# Patient Record
Sex: Female | Born: 1985 | Race: Black or African American | Hispanic: No | Marital: Single | Smoking: Former smoker
Health system: Southern US, Community
[De-identification: ages and names within clinical notes are randomized; demographics above are authoritative.]

## PROBLEM LIST (undated history)

## (undated) ENCOUNTER — Inpatient Hospital Stay (HOSPITAL_COMMUNITY): Payer: Self-pay

## (undated) ENCOUNTER — Ambulatory Visit (HOSPITAL_COMMUNITY): Admission: EM | Payer: Self-pay | Source: Home / Self Care

## (undated) DIAGNOSIS — R51 Headache: Secondary | ICD-10-CM

## (undated) DIAGNOSIS — R519 Headache, unspecified: Secondary | ICD-10-CM

## (undated) DIAGNOSIS — O3430 Maternal care for cervical incompetence, unspecified trimester: Secondary | ICD-10-CM

## (undated) DIAGNOSIS — E119 Type 2 diabetes mellitus without complications: Secondary | ICD-10-CM

## (undated) DIAGNOSIS — N390 Urinary tract infection, site not specified: Secondary | ICD-10-CM

## (undated) HISTORY — PX: DILATION AND CURETTAGE OF UTERUS: SHX78

## (undated) HISTORY — PX: CERVICAL CERCLAGE: SHX1329

---
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
## 2008-04-23 ENCOUNTER — Emergency Department (HOSPITAL_COMMUNITY): Admission: EM | Admit: 2008-04-23 | Discharge: 2008-04-23 | Payer: Self-pay | Admitting: Emergency Medicine

## 2011-03-22 LAB — URINALYSIS, ROUTINE W REFLEX MICROSCOPIC
Bilirubin Urine: NEGATIVE
Nitrite: NEGATIVE
Specific Gravity, Urine: 1.012
Urobilinogen, UA: 1

## 2011-03-22 LAB — GC/CHLAMYDIA PROBE AMP, GENITAL
Chlamydia, DNA Probe: NEGATIVE
GC Probe Amp, Genital: NEGATIVE

## 2011-03-22 LAB — WET PREP, GENITAL
Trich, Wet Prep: NONE SEEN
WBC, Wet Prep HPF POC: NONE SEEN
Yeast Wet Prep HPF POC: NONE SEEN

## 2011-03-22 LAB — URINE MICROSCOPIC-ADD ON

## 2011-04-19 ENCOUNTER — Emergency Department (HOSPITAL_COMMUNITY)
Admission: EM | Admit: 2011-04-19 | Discharge: 2011-04-20 | Disposition: A | Discharge status: Home / Self Care | Payer: Self-pay | Attending: Emergency Medicine | Admitting: Emergency Medicine

## 2011-04-19 DIAGNOSIS — O99891 Other specified diseases and conditions complicating pregnancy: Secondary | ICD-10-CM | POA: Insufficient documentation

## 2011-04-19 DIAGNOSIS — R109 Unspecified abdominal pain: Secondary | ICD-10-CM | POA: Insufficient documentation

## 2011-04-20 ENCOUNTER — Emergency Department (HOSPITAL_COMMUNITY): Payer: Self-pay

## 2011-04-20 LAB — HCG, QUANTITATIVE, PREGNANCY: hCG, Beta Chain, Quant, S: 596 m[IU]/mL — ABNORMAL HIGH (ref <5)

## 2011-04-20 LAB — URINALYSIS, ROUTINE W REFLEX MICROSCOPIC
Glucose, UA: NEGATIVE mg/dL
Hgb urine dipstick: NEGATIVE
Protein, ur: NEGATIVE mg/dL
Specific Gravity, Urine: 1.013 (ref 1.005–1.030)

## 2011-04-20 LAB — POCT PREGNANCY, URINE
Preg Test, Ur: NEGATIVE
Preg Test, Ur: POSITIVE

## 2012-01-15 ENCOUNTER — Emergency Department (HOSPITAL_COMMUNITY)
Admission: EM | Admit: 2012-01-15 | Discharge: 2012-01-15 | Disposition: A | Discharge status: Home / Self Care | Payer: Medicaid Other | Attending: Emergency Medicine | Admitting: Emergency Medicine

## 2012-01-15 ENCOUNTER — Encounter (HOSPITAL_COMMUNITY): Payer: Self-pay | Admitting: *Deleted

## 2012-01-15 ENCOUNTER — Emergency Department (HOSPITAL_COMMUNITY): Payer: Medicaid Other

## 2012-01-15 DIAGNOSIS — O26899 Other specified pregnancy related conditions, unspecified trimester: Secondary | ICD-10-CM | POA: Insufficient documentation

## 2012-01-15 DIAGNOSIS — N949 Unspecified condition associated with female genital organs and menstrual cycle: Secondary | ICD-10-CM | POA: Insufficient documentation

## 2012-01-15 DIAGNOSIS — R109 Unspecified abdominal pain: Secondary | ICD-10-CM | POA: Insufficient documentation

## 2012-01-15 DIAGNOSIS — R102 Pelvic and perineal pain unspecified side: Secondary | ICD-10-CM

## 2012-01-15 DIAGNOSIS — Z331 Pregnant state, incidental: Secondary | ICD-10-CM

## 2012-01-15 LAB — URINALYSIS, ROUTINE W REFLEX MICROSCOPIC
Nitrite: NEGATIVE
Specific Gravity, Urine: 1.014 (ref 1.005–1.030)
pH: 6 (ref 5.0–8.0)

## 2012-01-15 LAB — CBC WITH DIFFERENTIAL/PLATELET
Eosinophils Absolute: 0.2 10*3/uL (ref 0.0–0.7)
Eosinophils Relative: 2 % (ref 0–5)
Hemoglobin: 11.2 g/dL — ABNORMAL LOW (ref 12.0–15.0)
Lymphs Abs: 2.8 10*3/uL (ref 0.7–4.0)
MCH: 29.7 pg (ref 26.0–34.0)
MCV: 84.1 fL (ref 78.0–100.0)
Monocytes Relative: 5 % (ref 3–12)
RBC: 3.77 MIL/uL — ABNORMAL LOW (ref 3.87–5.11)

## 2012-01-15 LAB — HCG, QUANTITATIVE, PREGNANCY: hCG, Beta Chain, Quant, S: 13144 m[IU]/mL — ABNORMAL HIGH (ref <5)

## 2012-01-15 LAB — BASIC METABOLIC PANEL
BUN: 11 mg/dL (ref 6–23)
Calcium: 9.1 mg/dL (ref 8.4–10.5)
GFR calc non Af Amer: >90 mL/min (ref >90)
Glucose, Bld: 91 mg/dL (ref 70–99)
Potassium: 3.4 mEq/L — ABNORMAL LOW (ref 3.5–5.1)

## 2012-01-15 LAB — URINE MICROSCOPIC-ADD ON

## 2012-01-15 LAB — POCT PREGNANCY, URINE: Preg Test, Ur: POSITIVE — AB

## 2012-01-15 LAB — WET PREP, GENITAL: Clue Cells Wet Prep HPF POC: NONE SEEN

## 2012-01-15 MED ORDER — HYDROCODONE-ACETAMINOPHEN 5-325 MG PO TABS
1.0000 | ORAL_TABLET | Freq: Once | ORAL | Status: AC
  Administered 2012-01-15: 1 via ORAL

## 2012-01-15 MED ORDER — PRENATAL COMPLETE 14-0.4 MG PO TABS: 1.0000 | ORAL_TABLET | Freq: Every day | ORAL | Status: DC

## 2012-01-15 MED ORDER — HYDROCODONE-ACETAMINOPHEN 5-325 MG PO TABS
1.0000 | ORAL_TABLET | Freq: Once | ORAL | Status: DC
  Filled 2012-01-15: qty 1

## 2012-01-15 NOTE — ED Provider Notes (Signed)
History     CSN: [id number]  Arrival date & time 01/15/12  0143   First MD Initiated Contact with Patient 01/15/12 0225      Chief Complaint  Patient presents with  . Abdominal Pain    (Consider location/radiation/quality/duration/timing/severity/associated sxs/prior treatment) HPI Comments: Patient presents with 2 days of sharp crampy, lower abdominal pain. The pain comes and goes. She denies any nausea, vomiting, change in bowel habits, dysuria, hematuria, vaginal bleeding or discharge. He does not know when her last period was but states she took a pregnancy test 2 weeks ago was negative. No chest pain or shortness of breath. She is G4 P2. She denies any back pain. Good by mouth intake and urine output.  The history is provided by the patient.    History reviewed. No pertinent past medical history.  Past Surgical History  Procedure Date  . Cholecystectomy     History reviewed. No pertinent family history.  History  Substance Use Topics  . Smoking status: Current Everyday Smoker  . Smokeless tobacco: Not on file  . Alcohol Use: No    OB History    Grav Para Term Preterm Abortions TAB SAB Ect Mult Living                  Review of Systems  Constitutional: Negative for fever, activity change and appetite change.  HENT: Negative for congestion and rhinorrhea.   Respiratory: Negative for cough, chest tightness and shortness of breath.   Cardiovascular: Negative for chest pain.  Gastrointestinal: Positive for abdominal pain. Negative for nausea, vomiting and diarrhea.  Genitourinary: Negative for dysuria, vaginal bleeding, vaginal discharge and difficulty urinating.  Musculoskeletal: Negative for back pain.  Skin: Negative for rash.  Neurological: Negative for dizziness and light-headedness.    Allergies  Review of patient's allergies indicates no known allergies.  Home Medications   Current Outpatient Rx  Name Route Sig Dispense Refill  . ASPIRIN 325 MG PO  TABS Oral Take 325 mg by mouth daily as needed. For pain      BP 128/82  Pulse 86  Temp 97.2 F (36.2 C) (Oral)  Resp 18  SpO2 100%  Physical Exam  Constitutional: She is oriented to person, place, and time. She appears well-developed and well-nourished. No distress.  HENT:  Head: Normocephalic and atraumatic.  Mouth/Throat: Oropharynx is clear and moist. No oropharyngeal exudate.  Eyes: Conjunctivae and EOM are normal. Pupils are equal, round, and reactive to light.  Neck: Normal range of motion. Neck supple.  Cardiovascular: Normal rate, regular rhythm and normal heart sounds.   Pulmonary/Chest: Effort normal and breath sounds normal. No respiratory distress.  Abdominal: Soft. There is tenderness. There is no rebound and no guarding.       Minimal tenderness with out peritoneal signs  Genitourinary:       Normal external genitalia. Cervix closed. No CMT, no adnexal tenderness. Minimal suprapubic pain.  Musculoskeletal: Normal range of motion. She exhibits no edema and no tenderness.  Neurological: She is alert and oriented to person, place, and time. No cranial nerve deficit.  Skin: Skin is warm.    ED Course  Procedures (including critical care time)  Labs Reviewed  URINALYSIS, ROUTINE W REFLEX MICROSCOPIC - Abnormal; Notable for the following:    Leukocytes, UA TRACE (*)     All other components within normal limits  POCT PREGNANCY, URINE - Abnormal; Notable for the following:    Preg Test, Ur POSITIVE (*)     All  other components within normal limits  HCG, SERUM, QUALITATIVE - Abnormal; Notable for the following:    Preg, Serum POSITIVE (*)     All other components within normal limits  CBC WITH DIFFERENTIAL - Abnormal; Notable for the following:    RBC 3.77 (*)     Hemoglobin 11.2 (*)     HCT 31.7 (*)     All other components within normal limits  BASIC METABOLIC PANEL - Abnormal; Notable for the following:    Potassium 3.4 (*)     All other components within  normal limits  WET PREP, GENITAL - Abnormal; Notable for the following:    WBC, Wet Prep HPF POC FEW (*)     All other components within normal limits  URINE MICROSCOPIC-ADD ON  ABO/RH  GC/CHLAMYDIA PROBE AMP, GENITAL  HCG, QUANTITATIVE, PREGNANCY   US Ob Comp Less 14 Wks  01/15/2012  *RADIOLOGY REPORT*  Clinical Data: Pregnant  OBSTETRIC <14 WK [place] AND TRANSVAGINAL OB US  Technique:  Both transabdominal and transvaginal ultrasound examinations were performed for complete evaluation of the gestation as well as the maternal uterus, adnexal regions, and pelvic cul-de-sac.  Transvaginal technique was performed to assess early pregnancy.  Comparison:  None.  Intrauterine gestational sac:  Visualized/normal in shape. Yolk sac: Identified Embryo: Identified Cardiac Activity: Identified Heart Rate: 101 bpm  CRL: 2.3  mm  5 w  5 d        [place] EDC: 09/11/2012  Maternal uterus/adnexae: No subchorionic hemorrhage. Retroverted uterus.  Normal sonographic appearance to the ovaries.  No free fluid.  IMPRESSION: Single intrauterine gestation with cardiac activity documented. Estimated age of 5 weeks 5 days by crown-rump length.  Original Report Authenticated By: Waneta Martins, M.D.   US Ob Transvaginal  01/15/2012  *RADIOLOGY REPORT*  Clinical Data: Pregnant  OBSTETRIC <14 WK [place] AND TRANSVAGINAL OB US  Technique:  Both transabdominal and transvaginal ultrasound examinations were performed for complete evaluation of the gestation as well as the maternal uterus, adnexal regions, and pelvic cul-de-sac.  Transvaginal technique was performed to assess early pregnancy.  Comparison:  None.  Intrauterine gestational sac:  Visualized/normal in shape. Yolk sac: Identified Embryo: Identified Cardiac Activity: Identified Heart Rate: 101 bpm  CRL: 2.3  mm  5 w  5 d        [place] EDC: 09/11/2012  Maternal uterus/adnexae: No subchorionic hemorrhage. Retroverted uterus.  Normal sonographic appearance to the ovaries.  No free fluid.   IMPRESSION: Single intrauterine gestation with cardiac activity documented. Estimated age of 5 weeks 5 days by crown-rump length.  Original Report Authenticated By: Waneta Martins, M.D.     No diagnosis found.    MDM  Crampy lower abdominal pain for the past 2 days without peritoneal signs. Vital stable. No associated symptoms.  Pregnancy test positive.  We'll perform ultrasound rule out ectopic and perform pelvic exam.  IUP confirmed on ultrasound. [redacted] weeks gestation with appropriate fetal heart rate. Pelvic exam benign.  Urinalysis negative. Abdomen soft and nontender without guarding or rebound.  Patient will followup with women's clinic. We'll provide prenatal vitamins. Patient advised to discontinue any aspirin use.    Glynn Octave, MD 01/15/12 [phone]

## 2012-01-15 NOTE — ED Notes (Signed)
Pt states lower abdominal pain. Sharp and in pelvis area. Pt denies problem with urination or bowels. Pt states some nausea but no vomiting.

## 2012-01-15 NOTE — ED Notes (Signed)
Report received, assumed care.  

## 2012-01-17 LAB — GC/CHLAMYDIA PROBE AMP, GENITAL
Chlamydia, DNA Probe: NEGATIVE
GC Probe Amp, Genital: NEGATIVE

## 2012-02-28 ENCOUNTER — Other Ambulatory Visit: Payer: Self-pay

## 2012-03-05 ENCOUNTER — Other Ambulatory Visit (HOSPITAL_COMMUNITY): Payer: Self-pay | Admitting: Obstetrics and Gynecology

## 2012-03-05 DIAGNOSIS — N883 Incompetence of cervix uteri: Secondary | ICD-10-CM

## 2012-03-05 DIAGNOSIS — Z3689 Encounter for other specified antenatal screening: Secondary | ICD-10-CM

## 2012-03-06 ENCOUNTER — Encounter (HOSPITAL_COMMUNITY): Payer: Self-pay | Admitting: Obstetrics and Gynecology

## 2012-03-23 ENCOUNTER — Encounter (HOSPITAL_COMMUNITY): Payer: Self-pay | Admitting: Pharmacist

## 2012-03-30 ENCOUNTER — Ambulatory Visit (HOSPITAL_COMMUNITY)
Admission: RE | Admit: 2012-03-30 | Discharge: 2012-03-30 | Disposition: A | Discharge status: Home / Self Care | Payer: Medicaid Other | Source: Ambulatory Visit | Attending: Obstetrics and Gynecology | Admitting: Obstetrics and Gynecology

## 2012-03-30 ENCOUNTER — Inpatient Hospital Stay (HOSPITAL_COMMUNITY): Payer: Medicaid Other | Admitting: Anesthesiology

## 2012-03-30 ENCOUNTER — Encounter (HOSPITAL_COMMUNITY): Payer: Self-pay | Admitting: Anesthesiology

## 2012-03-30 ENCOUNTER — Encounter (HOSPITAL_COMMUNITY): Payer: Self-pay | Admitting: Obstetrics and Gynecology

## 2012-03-30 ENCOUNTER — Observation Stay (HOSPITAL_COMMUNITY)
Admission: RE | Admit: 2012-03-30 | Discharge: 2012-03-31 | Disposition: A | Discharge status: Home / Self Care | Payer: Medicaid Other | Source: Ambulatory Visit | Attending: Obstetrics and Gynecology | Admitting: Obstetrics and Gynecology

## 2012-03-30 ENCOUNTER — Encounter (HOSPITAL_COMMUNITY): Payer: Self-pay

## 2012-03-30 ENCOUNTER — Other Ambulatory Visit (HOSPITAL_COMMUNITY): Payer: Self-pay | Admitting: Obstetrics and Gynecology

## 2012-03-30 ENCOUNTER — Encounter (HOSPITAL_COMMUNITY)
Admission: RE | Disposition: A | Discharge status: Home / Self Care | Payer: Self-pay | Source: Ambulatory Visit | Attending: Obstetrics and Gynecology

## 2012-03-30 DIAGNOSIS — O358XX0 Maternal care for other (suspected) fetal abnormality and damage, not applicable or unspecified: Secondary | ICD-10-CM | POA: Insufficient documentation

## 2012-03-30 DIAGNOSIS — N883 Incompetence of cervix uteri: Secondary | ICD-10-CM

## 2012-03-30 DIAGNOSIS — O3430 Maternal care for cervical incompetence, unspecified trimester: Secondary | ICD-10-CM | POA: Insufficient documentation

## 2012-03-30 DIAGNOSIS — Z3689 Encounter for other specified antenatal screening: Secondary | ICD-10-CM

## 2012-03-30 DIAGNOSIS — Z363 Encounter for antenatal screening for malformations: Secondary | ICD-10-CM | POA: Insufficient documentation

## 2012-03-30 DIAGNOSIS — O09219 Supervision of pregnancy with history of pre-term labor, unspecified trimester: Secondary | ICD-10-CM | POA: Insufficient documentation

## 2012-03-30 DIAGNOSIS — Z1389 Encounter for screening for other disorder: Secondary | ICD-10-CM | POA: Insufficient documentation

## 2012-03-30 HISTORY — DX: Maternal care for cervical incompetence, unspecified trimester: O34.30

## 2012-03-30 HISTORY — PX: CERVICAL CERCLAGE: SHX1329

## 2012-03-30 LAB — CBC WITH DIFFERENTIAL/PLATELET
Basophils Relative: 0 % (ref 0–1)
Eosinophils Relative: 3 % (ref 0–5)
HCT: 34.9 % — ABNORMAL LOW (ref 36.0–46.0)
Hemoglobin: 12 g/dL (ref 12.0–15.0)
MCHC: 34.4 g/dL (ref 30.0–36.0)
MCV: 84.5 fL (ref 78.0–100.0)
Monocytes Absolute: 0.5 10*3/uL (ref 0.1–1.0)
Monocytes Relative: 4 % (ref 3–12)
Neutro Abs: 8.2 10*3/uL — ABNORMAL HIGH (ref 1.7–7.7)
RDW: 13.1 % (ref 11.5–15.5)

## 2012-03-30 LAB — SURGICAL PCR SCREEN
MRSA, PCR: NEGATIVE
Staphylococcus aureus: NEGATIVE

## 2012-03-30 SURGERY — CERCLAGE, CERVIX, VAGINAL APPROACH
Anesthesia: Spinal | Site: Vagina | Wound class: Clean Contaminated

## 2012-03-30 MED ORDER — DEXTROSE 5 % IV SOLN
500.0000 mg | INTRAVENOUS | Status: DC
  Administered 2012-03-30: 500 mg via INTRAVENOUS
  Filled 2012-03-30 (×2): qty 500

## 2012-03-30 MED ORDER — PHENYLEPHRINE HCL 10 MG/ML IJ SOLN
INTRAMUSCULAR | Status: DC | PRN
  Administered 2012-03-30: 40 ug via INTRAVENOUS
  Administered 2012-03-30 (×2): 80 ug via INTRAVENOUS

## 2012-03-30 MED ORDER — IBUPROFEN 800 MG PO TABS
800.0000 mg | ORAL_TABLET | Freq: Once | ORAL | Status: AC
  Administered 2012-03-30: 800 mg via ORAL
  Filled 2012-03-30: qty 1

## 2012-03-30 MED ORDER — CITRIC ACID-SODIUM CITRATE 334-500 MG/5ML PO SOLN
30.0000 mL | Freq: Once | ORAL | Status: AC
  Administered 2012-03-30: 30 mL via ORAL
  Filled 2012-03-30: qty 15

## 2012-03-30 MED ORDER — LACTATED RINGERS IV SOLN
INTRAVENOUS | Status: DC
  Administered 2012-03-31: 1000 mL via INTRAVENOUS

## 2012-03-30 MED ORDER — PHENYLEPHRINE 40 MCG/ML (10ML) SYRINGE FOR IV PUSH (FOR BLOOD PRESSURE SUPPORT)
PREFILLED_SYRINGE | INTRAVENOUS | Status: AC
  Filled 2012-03-30: qty 5

## 2012-03-30 MED ORDER — KETOROLAC TROMETHAMINE 30 MG/ML IJ SOLN
INTRAMUSCULAR | Status: DC | PRN
  Administered 2012-03-30: 30 mg via INTRAVENOUS

## 2012-03-30 MED ORDER — CALCIUM CARBONATE ANTACID 500 MG PO CHEW
2.0000 | CHEWABLE_TABLET | ORAL | Status: DC | PRN
  Filled 2012-03-30: qty 2

## 2012-03-30 MED ORDER — 0.9 % SODIUM CHLORIDE (POUR BTL) OPTIME
TOPICAL | Status: DC | PRN
  Administered 2012-03-30: 1000 mL

## 2012-03-30 MED ORDER — FENTANYL CITRATE 0.05 MG/ML IJ SOLN: 25.0000 ug | INTRAMUSCULAR | Status: DC | PRN

## 2012-03-30 MED ORDER — ACETAMINOPHEN 325 MG PO TABS: 650.0000 mg | ORAL_TABLET | ORAL | Status: DC | PRN

## 2012-03-30 MED ORDER — PRENATAL MULTIVITAMIN CH
1.0000 | ORAL_TABLET | Freq: Every day | ORAL | Status: DC
  Filled 2012-03-30: qty 1

## 2012-03-30 MED ORDER — LACTATED RINGERS IV SOLN
INTRAVENOUS | Status: DC | PRN
  Administered 2012-03-30 (×2): via INTRAVENOUS

## 2012-03-30 MED ORDER — LACTATED RINGERS IV SOLN
INTRAVENOUS | Status: DC
  Administered 2012-03-30: 18:00:00 via INTRAVENOUS

## 2012-03-30 MED ORDER — DOCUSATE SODIUM 100 MG PO CAPS
100.0000 mg | ORAL_CAPSULE | Freq: Every day | ORAL | Status: DC
  Filled 2012-03-30: qty 1

## 2012-03-30 MED ORDER — CEFAZOLIN SODIUM-DEXTROSE 2-3 GM-% IV SOLR
2.0000 g | INTRAVENOUS | Status: AC
  Administered 2012-03-30: 2 g via INTRAVENOUS
  Filled 2012-03-30: qty 50

## 2012-03-30 MED ORDER — FAMOTIDINE IN NACL 20-0.9 MG/50ML-% IV SOLN
20.0000 mg | Freq: Once | INTRAVENOUS | Status: AC
  Administered 2012-03-30: 20 mg via INTRAVENOUS
  Filled 2012-03-30: qty 50

## 2012-03-30 MED ORDER — ZOLPIDEM TARTRATE 5 MG PO TABS: 5.0000 mg | ORAL_TABLET | Freq: Every evening | ORAL | Status: DC | PRN

## 2012-03-30 MED ORDER — CEFAZOLIN SODIUM-DEXTROSE 2-3 GM-% IV SOLR: 2.0000 g | INTRAVENOUS | Status: DC

## 2012-03-30 MED ORDER — KETOROLAC TROMETHAMINE 30 MG/ML IJ SOLN
INTRAMUSCULAR | Status: AC
  Filled 2012-03-30: qty 1

## 2012-03-30 MED ORDER — KETOROLAC TROMETHAMINE 30 MG/ML IJ SOLN
15.0000 mg | Freq: Once | INTRAMUSCULAR | Status: AC | PRN
Start: 2012-03-30 — End: 2012-03-30

## 2012-03-30 SURGICAL SUPPLY — 26 items
CANISTER SUCTION 2500CC (MISCELLANEOUS) ×2 IMPLANT
CATH FOLEY 2WAY SLVR 30CC 16FR (CATHETERS) ×2 IMPLANT
CATH ROBINSON RED A/P 16FR (CATHETERS) ×2 IMPLANT
CLOTH BEACON ORANGE TIMEOUT ST (SAFETY) ×2 IMPLANT
COUNTER NEEDLE 1200 MAGNETIC (NEEDLE) ×2 IMPLANT
GAUZE SPONGE 4X4 16PLY XRAY LF (GAUZE/BANDAGES/DRESSINGS) ×2 IMPLANT
GLOVE BIO SURGEON STRL SZ 6.5 (GLOVE) ×2 IMPLANT
GLOVE BIO SURGEON STRL SZ7 (GLOVE) ×2 IMPLANT
GLOVE SKINSENSE NS SZ7.5 (GLOVE) ×1
GLOVE SKINSENSE NS SZ8.0 LF (GLOVE) ×1
GLOVE SKINSENSE STRL SZ7.5 (GLOVE) ×1 IMPLANT
GLOVE SKINSENSE STRL SZ8.0 LF (GLOVE) ×1 IMPLANT
GOWN PREVENTION PLUS LG XLONG (DISPOSABLE) ×4 IMPLANT
NEEDLE MAYO .5 CIRCLE (NEEDLE) ×2 IMPLANT
NEEDLE SPNL 22GX3.5 QUINCKE BK (NEEDLE) IMPLANT
NS IRRIG 1000ML POUR BTL (IV SOLUTION) ×2 IMPLANT
PACK VAGINAL MINOR WOMEN LF (CUSTOM PROCEDURE TRAY) ×2 IMPLANT
PAD OB MATERNITY 4.3X12.25 (PERSONAL CARE ITEMS) ×2 IMPLANT
PAD PREP 24X48 CUFFED NSTRL (MISCELLANEOUS) ×2 IMPLANT
SUT PROLENE 1 CTX 30  8455H (SUTURE) ×2
SUT PROLENE 1 CTX 30 8455H (SUTURE) ×2 IMPLANT
SYR CONTROL 10ML LL (SYRINGE) IMPLANT
TOWEL OR 17X24 6PK STRL BLUE (TOWEL DISPOSABLE) ×4 IMPLANT
TUBING NON-CON 1/4 X 20 CONN (TUBING) ×2 IMPLANT
WATER STERILE IRR 1000ML POUR (IV SOLUTION) IMPLANT
YANKAUER SUCT BULB TIP NO VENT (SUCTIONS) ×2 IMPLANT

## 2012-03-30 NOTE — Preoperative (Signed)
Beta Blockers   Reason not to administer Beta Blockers:Not Applicable 

## 2012-03-30 NOTE — Brief Op Note (Signed)
03/30/2012  11:12 PM  PATIENT:  [name]  26 y.o. female  PRE-OPERATIVE DIAGNOSIS:  incompetent cervix  POST-OPERATIVE DIAGNOSIS:  ncompetent cervix  PROCEDURE:  Procedure(s) (LRB) with comments: CERCLAGE CERVICAL (N/A)  SURGEON:  Surgeon(s) and Role:    * Rolly Magri Bovard, MD - Primary  ANESTHESIA:   spinal and IV sedation  EBL:  Total I/O In: 2520.8 [I.V.:2220.8; IV Piggyback:300] Out: 500 [Urine:400; Blood:100]  BLOOD ADMINISTERED:none  DRAINS: none   LOCAL MEDICATIONS USED:  NONE  SPECIMEN:  No Specimen  DISPOSITION OF SPECIMEN:  N/A  COUNTS:  YES  TOURNIQUET:  * No tourniquets in log *  DICTATION: .Other Dictation: Dictation Number [id number]  PLAN OF CARE: Admit for overnight observation  PATIENT DISPOSITION:  PACU - hemodynamically stable.   Delay start of Pharmacological VTE agent (>24hrs) due to surgical blood loss or risk of bleeding: not applicable

## 2012-03-30 NOTE — H&P (Signed)
[name] is a 26 y.o. female [phone] at 17wk with incompetent cervix for rescue cerclage.  On [place] minimal cervix.  D/W MFM was scheduled for prophylactic cerclage next week for questionable indication.  D/w pt r/b/a of rescue cerclage. Maternal Medical History:  Fetal activity: Perceived fetal activity is normal.      OB History    Grav Para Term Preterm Abortions TAB SAB Ect Mult Living   1             G5P2022 G1 MAB G2 24wk dilitation to 1cm, bedrest, delivery at term G3 prophylactic cerclage, term SVD G4 SAB G5 present  + trich, GC, Chl No abn pap  Past Medical History  Diagnosis Date  . Incompetent cervix in pregnancy 03/30/2012  sickle trait  Past Surgical History  Procedure Date  . Cholecystectomy   cerclage WTE Family History: Br CA, DM, CAD, ESRD Social History:  reports that she has been smoking.  She does not have any smokeless tobacco history on file. She reports that she does not drink alcohol or use illicit drugs.     Prenatal Transfer Tool  Maternal Diabetes: No Genetic Screening: Normal Maternal Ultrasounds/Referrals: Abnormal:  Findings:   Other: incompetent cervix Fetal Ultrasounds or other Referrals:  None, Referred to Materal Fetal Medicine  Maternal Substance Abuse:  No h/o tob Significant Maternal Medications:  None Significant Maternal Lab Results:  None Other Comments:  sickle trait  Review of Systems  Constitutional: Negative.   HENT: Negative.   Eyes: Negative.   Respiratory: Negative.   Cardiovascular: Negative.   Gastrointestinal: Negative.   Genitourinary: Negative.   Musculoskeletal: Negative.   Skin: Negative.   Neurological: Negative.   Endo/Heme/Allergies: Negative.   Psychiatric/Behavioral: Negative.       Last menstrual period 12/03/2011. Maternal Exam:  Abdomen: Fundal height is below umbilicus.    Cervix: Cervix evaluated by sterile speculum exam.     Physical Exam  Constitutional: She is oriented to  person, place, and time. She appears well-developed and well-nourished.  HENT:  Head: Normocephalic and atraumatic.  Eyes: Conjunctivae normal are normal. Pupils are equal, round, and reactive to light.  Neck: Normal range of motion. Neck supple.  Cardiovascular: Normal rate and regular rhythm.   Respiratory: Effort normal and breath sounds normal. No respiratory distress.  GI: Soft. Bowel sounds are normal. There is no tenderness.  Musculoskeletal: Normal range of motion.  Neurological: She is alert and oriented to person, place, and time.  Skin: Skin is warm and dry.  Psychiatric: She has a normal mood and affect. Her behavior is normal.    Prenatal labs: ABO, Rh: --/--/O POS (07/28 0301) Antibody:  negative Rubella:  immune RPR:   NR HBsAg:   neg HIV:   neg GBS:   N/A  GC neg/ Chl neg/ First Tri Scr WNL/AFP WNL  Assessment/Plan: Admit for rescue cerclage abx with Azithro and Ancef Ibuprofen 800mg  x 72 hr D/w pt r/b/a rescue cerclage    BOVARD,Archie Shea 03/30/2012, 1:30 PM

## 2012-03-30 NOTE — Transfer of Care (Signed)
Immediate Anesthesia Transfer of Care Note  Patient: [name]  Procedure(s) Performed: Procedure(s) (LRB) with comments: CERCLAGE CERVICAL (N/A)  Patient Location: PACU  Anesthesia Type: Spinal  Level of Consciousness: awake, alert  and oriented  Airway & Oxygen Therapy: Patient Spontanous Breathing  Post-op Assessment: Report given to PACU RN  Post vital signs: Reviewed  Complications: No apparent anesthesia complications

## 2012-03-30 NOTE — H&P (View-Only) (Signed)
Patient ID: [name], female   DOB: July 24, 1985, 24 y.o.   MRN: [id number]   D/W pt cerclage and r/b/a, including but not limited to bleeding, infection, ROM, not prolonging pregnancy.  Pt voices understanding.    Will proceed after pt 8 hrs NPO, ate before presentation to hospital.  Plan for 9:30pm.  Ancef and Zithromax for pre-op Also ibuprofen po q 8hr for 72 hr Possible toradol after procedure. Will use 30cc foley bulb to displace membranes for procedure

## 2012-03-30 NOTE — Progress Notes (Signed)
Patient ID: [name], female   DOB: 1986-04-11, 26 y.o.   MRN: [id number] Pt s/p rescue cerclage  2 stitches, knots at 12 o'clock  Ibuprofen ATC  x 72 hrs Percocet prn

## 2012-03-30 NOTE — Interval H&P Note (Signed)
History and Physical Interval Note:  03/30/2012 9:36 PM  [name]  has presented today for surgery, with the diagnosis of incompetent cervix  The various methods of treatment have been discussed with the patient and family. After consideration of risks, benefits and other options for treatment, the patient has consented to  Procedure(s) (LRB) with comments: CERCLAGE CERVICAL (N/A) as a surgical intervention .  The patient's history has been reviewed, patient examined, no change in status, stable for surgery.  I have reviewed the patient's chart and labs.  Questions were answered to the patient's satisfaction.     BOVARD,Anthonio Mizzell

## 2012-03-30 NOTE — Anesthesia Preprocedure Evaluation (Addendum)
Anesthesia Evaluation  Patient identified by MRN, date of birth, ID band Patient awake    Reviewed: Allergy & Precautions, H&P , NPO status , Patient's Chart, lab work & pertinent test results, reviewed documented beta blocker date and time   History of Anesthesia Complications Negative for: history of anesthetic complications  Airway Mallampati: III TM Distance: >3 FB Neck ROM: full    Dental  (+) Teeth Intact   Pulmonary  breath sounds clear to auscultation        Cardiovascular negative cardio ROS  Rhythm:regular Rate:Normal     Neuro/Psych negative neurological ROS  negative psych ROS   GI/Hepatic negative GI ROS, Neg liver ROS,   Endo/Other  negative endocrine ROS  Renal/GU negative Renal ROS  negative genitourinary   Musculoskeletal   Abdominal   Peds  Hematology negative hematology ROS (+)   Anesthesia Other Findings Last ate spaghetti at 1:40 pm  Reproductive/Obstetrics (+) Pregnancy (incompetent cervix)                          Anesthesia Physical Anesthesia Plan  ASA: II  Anesthesia Plan: Spinal   Post-op Pain Management:    Induction:   Airway Management Planned:   Additional Equipment:   Intra-op Plan:   Post-operative Plan:   Informed Consent: I have reviewed the patients History and Physical, chart, labs and discussed the procedure including the risks, benefits and alternatives for the proposed anesthesia with the patient or authorized representative who has indicated his/her understanding and acceptance.     Plan Discussed with: CRNA and Surgeon  Anesthesia Plan Comments:         Anesthesia Quick Evaluation

## 2012-03-30 NOTE — Brief Op Note (Signed)
03/30/2012  11:01 PM  PATIENT:  [name]  26 y.o. female  PRE-OPERATIVE DIAGNOSIS:  incompetent cervix  POST-OPERATIVE DIAGNOSIS:  ncompetent cervix  PROCEDURE:  Procedure(s) (LRB) with comments: CERCLAGE CERVICAL (N/A) rescue  SURGEON:  Surgeon(s) and Role:    * Kalid Ghan Bovard, MD - Primary  ANESTHESIA:   spinal and IV sedation  EBL:  Total I/O In: 2520.8 [I.V.:2220.8; IV Piggyback:300] Out: 500 [Urine:400; Blood:100]  FINDINGS: 16wk size uterus, membranes to os  BLOOD ADMINISTERED:none  DRAINS: none   LOCAL MEDICATIONS USED:  NONE  SPECIMEN:  No Specimen  DISPOSITION OF SPECIMEN:  N/A  COUNTS:  YES  TOURNIQUET:  * No tourniquets in log *  DICTATION: .Other Dictation: Dictation Number [id number]  PLAN OF CARE: Admit for overnight observation  PATIENT DISPOSITION:  PACU - hemodynamically stable.   Delay start of Pharmacological VTE agent (>24hrs) due to surgical blood loss or risk of bleeding: not applicable

## 2012-03-30 NOTE — Progress Notes (Signed)
To OR via stretcher for scheduled surgery.

## 2012-03-30 NOTE — Anesthesia Procedure Notes (Signed)
Spinal  Patient location during procedure: OR Preanesthetic Checklist Completed: patient identified, site marked, surgical consent, pre-op evaluation, timeout performed, IV checked, risks and benefits discussed and monitors and equipment checked Spinal Block Patient position: sitting Prep: DuraPrep Patient monitoring: heart rate, cardiac monitor, continuous pulse ox and blood pressure Approach: midline Location: L3-4 Injection technique: single-shot Needle Needle type: Sprotte  Needle gauge: 24 G Needle length: 9 cm Assessment Sensory level: T8 Additional Notes Spinal Dosage in OR  Bupivicaine ml       1.3    

## 2012-03-30 NOTE — Progress Notes (Signed)
Patient ID: [name], female   DOB: 03/15/1986, 26 y.o.   MRN: [id number]   D/W pt cerclage and r/b/a, including but not limited to bleeding, infection, ROM, not prolonging pregnancy.  Pt voices understanding.    Will proceed after pt 8 hrs NPO, ate before presentation to hospital.  Plan for 9:30pm.  Ancef and Zithromax for pre-op Also ibuprofen po q 8hr for 72 hr Possible toradol after procedure. Will use 30cc foley bulb to displace membranes for procedure  

## 2012-03-30 NOTE — Progress Notes (Signed)
Dr Rodman Pickle notified of pt in AICU 373 for pre-op prep for rescue cerclage by Dr Ellyn Hack

## 2012-03-30 NOTE — Progress Notes (Signed)
[name]  was seen today for an ultrasound appointment.  See full report in [facility].  Alpha Gula, MD  Single IUP at 16 3/7 weeks Somewhat limited views of the fetal anatomy were obtained due to early gestational age, but no gross fetal anomalies were noted No markers associated with aneuploidy were seen Normal amniotic fluid volume  TVUS - cervical length approx 2 mm with some U-shaped funneling; some debris is noted  On speculum exam, fetal membranes noted at the external os, but not prolapsing into the vagina    Findings were discussed with Dr. Ellyn Hack. Will check CBC with diff now; if elevated WBC count, would recommend amniocentesis to rule out intra-amniotic infection Plan admission for possible rescue cerclage today   Recommend follow up ultrasound in 4 weeks to reasses fetal heart anatomy.

## 2012-03-31 ENCOUNTER — Encounter (HOSPITAL_COMMUNITY): Payer: Self-pay

## 2012-03-31 LAB — CBC
MCH: 29.5 pg (ref 26.0–34.0)
Platelets: 198 10*3/uL (ref 150–400)
RBC: 3.05 MIL/uL — ABNORMAL LOW (ref 3.87–5.11)
WBC: 10.1 10*3/uL (ref 4.0–10.5)

## 2012-03-31 MED ORDER — IBUPROFEN 800 MG PO TABS
800.0000 mg | ORAL_TABLET | Freq: Three times a day (TID) | ORAL | Status: DC
  Administered 2012-03-31: 800 mg via ORAL
  Filled 2012-03-31: qty 1

## 2012-03-31 MED ORDER — IBUPROFEN 800 MG PO TABS: 800.0000 mg | ORAL_TABLET | Freq: Three times a day (TID) | ORAL | Status: DC

## 2012-03-31 MED ORDER — ONDANSETRON HCL 4 MG PO TABS: 8.0000 mg | ORAL_TABLET | Freq: Three times a day (TID) | ORAL | Status: DC | PRN

## 2012-03-31 MED ORDER — OXYCODONE-ACETAMINOPHEN 5-325 MG PO TABS: 1.0000 | ORAL_TABLET | ORAL | Status: DC | PRN

## 2012-03-31 MED ORDER — PRENATAL MULTIVITAMIN CH: 1.0000 | ORAL_TABLET | Freq: Every day | ORAL | Status: DC

## 2012-03-31 NOTE — Progress Notes (Signed)
Patient ID: [name], female   DOB: [birth year], 26 y.o.   MRN: [id number] #1 afebrile No heavy bleeding for d/c.

## 2012-03-31 NOTE — Op Note (Signed)
[name], [name] NO.:  [id number]  MEDICAL RECORD NO.:  [id number]  LOCATION:  9155                          FACILITY:  [facility]  PHYSICIAN:  Sherron Monday, MD        DATE OF BIRTH:  [birth year]  DATE OF PROCEDURE:  03/30/2012 DATE OF DISCHARGE:  03/30/2012                              OPERATIVE REPORT   PREOPERATIVE DIAGNOSIS:  Incompetent cervix.  POSTOPERATIVE DIAGNOSIS:  Incompetent cervix.  PROCEDURE:  Rescue [place] cervical cerclage.  SURGEON:  Sherron Monday, MD  ANESTHESIA:  Spinal with IV sedation.  ESTIMATED BLOOD LOSS:  100 mL.  IV FLUIDS:  20 mL.  URINE OUTPUT:  400 mL I and O cath.  FINDINGS:  Approximately 16-week size uterus with membranes with minimal Cervical length and membranes prolapsing to the os.  COMPLICATIONS:  None.  PATHOLOGY:  None.  DISPOSITION:  Stable to PACU.  PROCEDURE IN DETAIL:  After informed consent was reviewed with the patient including risks, benefits, and alternatives of surgical procedure including but not limited to bleeding, infection, rupture of membranes, and lack of prolongation of pregnancy, she was transported to the OR, where spinal anesthesia was induced and found to be adequate. She was then placed in supine position and then in the [place] stirrups.  Her bladder was sterilely drained.  Using an open-sided speculum, her cervix was visualized with the above findings.  Her vagina was gently cleansed with normal saline.  A 30 mL Foley balloon bulb was placed through her cervix and inflated to move the membranes from the os.  Several bites of the cervix were taken in a counter-clockwise fashion, and the first knot was tied at approximately 12 o'clock and this was approximately 5 mm from end of the cervix and the second stitches procedure approximately 5 mm anterior to this.  The knot was tied at approximately 12 o'clock, and the patient tolerated procedure well.  The cervix was examined after  the stitches were placed and thought to be closed to fingertip and adding back approximately 0.5 cm in length.  The patient tolerated the procedure well.  Sponge, lap, and needle counts were correct x2 at the end of the procedure.     Sherron Monday, MD     JB/MEDQ  D:  03/30/2012  T:  03/31/2012  Job:  [id number]

## 2012-03-31 NOTE — Anesthesia Postprocedure Evaluation (Signed)
  Anesthesia Post-op Note  Patient: [name]  Procedure(s) Performed: Procedure(s) (LRB) with comments: CERCLAGE CERVICAL (N/A)   Patient is awake, responsive, moving her legs, and has signs of resolution of her numbness. Pain and nausea are reasonably well controlled. Vital signs are stable and clinically acceptable. Oxygen saturation is clinically acceptable. There are no apparent anesthetic complications at this time. Patient is ready for discharge.

## 2012-03-31 NOTE — Anesthesia Postprocedure Evaluation (Signed)
  Anesthesia Post-op Note  Patient: [name]  Procedure(s) Performed: Procedure(s) (LRB) with comments: CERCLAGE CERVICAL (N/A)  Patient Location: Antenatal  Anesthesia Type: Spinal  Level of Consciousness: awake and oriented  Airway and Oxygen Therapy: Patient Spontanous Breathing  Post-op Pain: mild  Post-op Assessment: Patient's Cardiovascular Status Stable, Respiratory Function Stable, Patent Airway, No signs of Nausea or vomiting and Pain level controlled  Post-op Vital Signs: stable  Complications: No apparent anesthesia complications

## 2012-04-01 ENCOUNTER — Encounter (HOSPITAL_COMMUNITY): Payer: Self-pay

## 2012-04-01 ENCOUNTER — Inpatient Hospital Stay (HOSPITAL_COMMUNITY)
Admission: AD | Admit: 2012-04-01 | Discharge: 2012-04-04 | DRG: 781 | Disposition: A | Discharge status: Home / Self Care | Payer: Medicaid Other | Source: Ambulatory Visit | Attending: Obstetrics and Gynecology | Admitting: Obstetrics and Gynecology

## 2012-04-01 ENCOUNTER — Inpatient Hospital Stay (HOSPITAL_COMMUNITY): Payer: Medicaid Other

## 2012-04-01 DIAGNOSIS — O3430 Maternal care for cervical incompetence, unspecified trimester: Principal | ICD-10-CM | POA: Diagnosis present

## 2012-04-01 DIAGNOSIS — N390 Urinary tract infection, site not specified: Secondary | ICD-10-CM | POA: Diagnosis present

## 2012-04-01 DIAGNOSIS — O2390 Unspecified genitourinary tract infection in pregnancy, unspecified trimester: Secondary | ICD-10-CM | POA: Diagnosis present

## 2012-04-01 LAB — URINALYSIS, ROUTINE W REFLEX MICROSCOPIC
Glucose, UA: NEGATIVE mg/dL
Leukocytes, UA: NEGATIVE
pH: 6 (ref 5.0–8.0)

## 2012-04-01 LAB — CBC
MCH: 29.6 pg (ref 26.0–34.0)
MCHC: 35.1 g/dL (ref 30.0–36.0)
Platelets: 231 10*3/uL (ref 150–400)
RBC: 3.35 MIL/uL — ABNORMAL LOW (ref 3.87–5.11)

## 2012-04-01 LAB — URINE MICROSCOPIC-ADD ON

## 2012-04-01 MED ORDER — OXYCODONE-ACETAMINOPHEN 5-325 MG PO TABS: 1.0000 | ORAL_TABLET | ORAL | Status: DC | PRN

## 2012-04-01 MED ORDER — LACTATED RINGERS IV BOLUS (SEPSIS)
500.0000 mL | Freq: Once | INTRAVENOUS | Status: AC
  Administered 2012-04-01: 500 mL via INTRAVENOUS

## 2012-04-01 MED ORDER — ZOLPIDEM TARTRATE 5 MG PO TABS: 5.0000 mg | ORAL_TABLET | Freq: Every evening | ORAL | Status: DC | PRN

## 2012-04-01 MED ORDER — SODIUM CHLORIDE 0.9 % IJ SOLN: 3.0000 mL | INTRAMUSCULAR | Status: DC | PRN

## 2012-04-01 MED ORDER — IBUPROFEN 600 MG PO TABS
600.0000 mg | ORAL_TABLET | Freq: Once | ORAL | Status: AC
  Administered 2012-04-01: 600 mg via ORAL
  Filled 2012-04-01: qty 1

## 2012-04-01 MED ORDER — HYDROMORPHONE HCL PF 1 MG/ML IJ SOLN
1.0000 mg | Freq: Once | INTRAMUSCULAR | Status: AC
  Administered 2012-04-01: 1 mg via INTRAMUSCULAR
  Filled 2012-04-01: qty 1

## 2012-04-01 MED ORDER — OXYCODONE-ACETAMINOPHEN 5-325 MG PO TABS
1.0000 | ORAL_TABLET | Freq: Once | ORAL | Status: AC
  Administered 2012-04-01: 1 via ORAL
  Filled 2012-04-01: qty 1

## 2012-04-01 MED ORDER — IBUPROFEN 800 MG PO TABS
800.0000 mg | ORAL_TABLET | Freq: Three times a day (TID) | ORAL | Status: AC
  Administered 2012-04-01 – 2012-04-02 (×6): 800 mg via ORAL
  Filled 2012-04-01 (×7): qty 1

## 2012-04-01 MED ORDER — PRENATAL MULTIVITAMIN CH
1.0000 | ORAL_TABLET | Freq: Every day | ORAL | Status: DC
  Administered 2012-04-01 – 2012-04-04 (×3): 1 via ORAL
  Filled 2012-04-01 (×3): qty 1

## 2012-04-01 MED ORDER — LACTATED RINGERS IV SOLN
INTRAVENOUS | Status: DC
  Administered 2012-04-01 – 2012-04-04 (×8): via INTRAVENOUS

## 2012-04-01 MED ORDER — DOCUSATE SODIUM 100 MG PO CAPS
100.0000 mg | ORAL_CAPSULE | Freq: Every day | ORAL | Status: DC
  Administered 2012-04-01 – 2012-04-04 (×4): 100 mg via ORAL
  Filled 2012-04-01 (×3): qty 1

## 2012-04-01 MED ORDER — ONDANSETRON HCL 4 MG PO TABS
8.0000 mg | ORAL_TABLET | Freq: Once | ORAL | Status: AC
  Administered 2012-04-01: 8 mg via ORAL
  Filled 2012-04-01: qty 2

## 2012-04-01 MED ORDER — CALCIUM CARBONATE ANTACID 500 MG PO CHEW
2.0000 | CHEWABLE_TABLET | ORAL | Status: DC | PRN
  Filled 2012-04-01: qty 2

## 2012-04-01 MED ORDER — BUTORPHANOL TARTRATE 1 MG/ML IJ SOLN: 2.0000 mg | INTRAMUSCULAR | Status: DC | PRN

## 2012-04-01 NOTE — MAU Note (Signed)
W.Muhammed CNM notified of pt's admission and status. Will see pt 

## 2012-04-01 NOTE — Progress Notes (Signed)
Spoke with Dr. Ellyn Hack regarding POC for patient. At this time orders received for expectant management of care. Dr. Ellyn Hack spoke with MFM and agrees with plan at this time. IVF, bedrest, motrin TID, IV pain meds PRN, Urine culture. Discussed with patient POC and instruction given to call if increased pain, bleeding, LOF. Pt verbalizes understanding.

## 2012-04-01 NOTE — MAU Provider Note (Signed)
History     CSN: [id number]  Arrival date & time 04/01/12  0444   None     Chief Complaint  Patient presents with  . Abdominal Pain  . Vaginal Bleeding    (Consider location/radiation/quality/duration/timing/severity/associated sxs/prior treatment) HPI Pt is a G3P2002 at 16.5 wks IUP here with report of intermittent cramping that began at 0230 with spotting of blood.  Pt received a cervical cerclage on 03/30/12 for incompetent cervix.  Pain is described as contractions and rated a 9/10.  Past Medical History  Diagnosis Date  . 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    Past Surgical History  Procedure Date  . Cholecystectomy 2008  . Cervical cerclage     No family history on file.  History  Substance Use Topics  . Smoking status: Current Every Day Smoker  . Smokeless tobacco: Not on file  . Alcohol Use: No    OB History    Grav Para Term Preterm Abortions TAB SAB Ect Mult Living   3 2 2       2       Review of Systems  Gastrointestinal: Positive for nausea and vomiting.  Genitourinary: Positive for vaginal bleeding and pelvic pain.  All other systems reviewed and are negative.    Allergies  Review of patient's allergies indicates no known allergies.  Home Medications  No current outpatient prescriptions on file.  BP 134/89  Pulse 101  Temp 98.4 F (36.9 C) (Oral)  Resp 20  SpO2 100%  LMP 12/03/2011  Physical Exam  Constitutional: She is oriented to person, place, and time. She appears well-developed and well-nourished. No distress.       Appears uncomfortable  HENT:  Head: Normocephalic and atraumatic.  Neck: Normal range of motion. Neck supple. No thyromegaly present.  Cardiovascular: Normal rate, regular rhythm and normal heart sounds.   Pulmonary/Chest: Effort normal and breath sounds normal. No respiratory distress.  Abdominal: Soft. Bowel sounds are normal. There is no tenderness.  Genitourinary:   Mucusy vaginal bleeding; stitch palpated at 12 oclock with cervical opening below stitch measuring approximately 2 cm  Musculoskeletal: Normal range of motion. She exhibits no edema.  Neurological: She is alert and oriented to person, place, and time.  Skin: Skin is warm and dry.    ED Course  Procedures (including critical care time)  Labs Reviewed - No data to display US Ob Detail + 14 Wk  03/30/2012  OBSTETRICAL ULTRASOUND: This exam was performed within a [facility]. The OB US report was generated in the AS system, and faxed to the ordering physician.   This report is also available in [employer] and in the [employer]. See AS Obstetric US report.   US Ob Transvaginal  03/30/2012  OBSTETRICAL ULTRASOUND: This exam was performed within a [facility]. The OB US report was generated in the AS system, and faxed to the ordering physician.   This report is also available in [employer] and in the [employer]. See AS Obstetric US report.   Ultrasound: Cervix completely effaced, cervical width 1.2 cm.   1. Incompetent cervix in pregnancy    Percocet 5/325 PO and Ibuprofen 600 mg PO>miminal relief Dilaudid 1mg  IM Zofran 8 mg PO  MDM   A: Incompetent Cervix Failed Cerclage  P: Consulted with Dr. Ambrose Mantle > DC pt home with preterm labor precautions and pain meds;  Follow-up with Dr. Ellyn Hack on Monday or  return to MAU prn. Education provided to patient in length about the rationale for sending patient home (non viability of fetus if delivered).  Explained unable to inform her about when and if delivery will occur.  At this time it is expectant management.   RX Percocet 5/325 #[facility]  Pt requested recheck of cervix d/t increased pressure. SVE 2-3/100/stitch intact. Pt states she is uncomfortable with plan to d/c to home, she will be alone there caring for 2 small children. Dr. Ambrose Mantle to  MAU to evaluate.

## 2012-04-01 NOTE — Progress Notes (Signed)
Dr. Ellyn Hack called to check on pt. Report given to MD. Orders to put end time of tomorrow night Motirn 800 in. Orders read back and verified.

## 2012-04-01 NOTE — Progress Notes (Signed)
Patient ID: [name], female   DOB: 06/30/1985, 26 y.o.   MRN: [id number] at 16+5 s/p cerclage (2 stitch McDonald, knots at 12 o'clock) Friday, readmitted with pain.  SVE 2cm, questionable change between 2 exams per MAU providers. Stitch felt intact  Was FT-1 after cerclage.  Pt declines removing stitch.  D/w MFM, will check CBC and Ur Cx.  Pt to get IVF continue 72hr ibuprofen course.  If still pregnant tomorrow, t/c amniocentesis.  Reiterated PTL and no survivabilty at 16 weeks.  Will continue to monitor.

## 2012-04-01 NOTE — H&P (Signed)
[name], [name] NO.:  [id number]  MEDICAL RECORD NO.:  [id number]  LOCATION:  9161                          FACILITY:  [facility]  PHYSICIAN:  Malachi Pro. Ambrose Mantle, M.D. DATE OF BIRTH:  [birth year]  DATE OF ADMISSION:  04/01/2012 DATE OF DISCHARGE:                             HISTORY & PHYSICAL   PRESENT ILLNESS:  This is a 26 year old black female, para 2-0-2-2, gravida 5, [redacted] weeks gestation, who underwent a rescue cerclage on March 30, 2012.  At that time, ultrasound showed minimal cervical length.  Dr. Ellyn Hack discussed with maternal fetal medicine.  She was scheduled for rescue cerclage.  PAST MEDICAL HISTORY:  No known drug allergies.  She was thought to have an incompetent cervix, sickle cell trait.  OPERATIONS:  She has had a prior cerclage and cholecystectomy.  FAMILY HISTORY:  She did have family history of breast cancer, diabetes, coronary artery disease, and esophageal reflux disorder.  SOCIAL HISTORY:  The patient had been smoking.  She has no history of smokeless tobacco.  She does not drink alcohol or use illicit drugs.  OBSTETRIC HISTORY:  First pregnancy, a missed abortion.  Second pregnancy, 24 weeks.  She dilated to 1 cm.  She was placed on bed rest. She delivered a term.  Third pregnancy, had a prophylactic cerclage and at term delivery.  Fourth pregnancy, spontaneous abortion.  She does have a history of trichomoniasis gonorrhea and Chlamydia.  PHYSICAL EXAMINATION:  VITAL SIGNS:  On admission, temperature 98.7, pulse 114, respirations 18, blood pressure 115/58. HEART:  Normal size and sounds.  No murmurs. LUNGS:  Clear to auscultation. ABDOMEN:  Consistent with gestational age.  The patient has been examined in the maternity admission unit this morning, 3 times by nurse practitioner's so I have not repeated the exam.  The last practitioner exam thought the cervix was 2-3 cm dilated.  The ultrasound from today suggested the cervix was  open and completely effaced.  Cervical width was 1.2 cm at the time of the exam.  ADMITTING IMPRESSION:  Intrauterine pregnancy at 17 weeks, incompetent cervix or premature dilatation of the cervix secondary to uterine activity.  I had offered the patient since her cervix was thought to be 2-3 cm, dilated to go ahead and remove the stitches.  The patient declines to do that, so I have admitted her for observation and discussed with her the oblique prospects for a good outcome.  I will discuss this case with Dr. Ellyn Hack for tomorrow or later today.  At the present time,  I am admitting her for bedrest.     Malachi Pro. Ambrose Mantle, M.D.     TFH/MEDQ  D:  04/01/2012  T:  04/01/2012  Job:  [id number]

## 2012-04-01 NOTE — MAU Note (Signed)
Patient is brought in by ems with c/o intense abdominal cramping and spotting. She states that she had a cerclage placed on Friday by dr bovard and was discharge on Saturday noon. She had a quarter size spot on her pad. New pad given.

## 2012-04-02 ENCOUNTER — Encounter (HOSPITAL_COMMUNITY): Payer: Self-pay | Admitting: Obstetrics and Gynecology

## 2012-04-02 ENCOUNTER — Inpatient Hospital Stay (HOSPITAL_COMMUNITY): Payer: Medicaid Other

## 2012-04-02 LAB — CBC
MCHC: 34.5 g/dL (ref 30.0–36.0)
Platelets: 199 10*3/uL (ref 150–400)
RDW: 13.2 % (ref 11.5–15.5)

## 2012-04-02 MED ORDER — NITROFURANTOIN MONOHYD MACRO 100 MG PO CAPS
100.0000 mg | ORAL_CAPSULE | Freq: Two times a day (BID) | ORAL | Status: DC
  Administered 2012-04-02 – 2012-04-04 (×5): 100 mg via ORAL
  Filled 2012-04-02 (×5): qty 1

## 2012-04-02 MED ORDER — AZITHROMYCIN 500 MG PO TABS
500.0000 mg | ORAL_TABLET | Freq: Every day | ORAL | Status: AC
  Administered 2012-04-02: 500 mg via ORAL
  Filled 2012-04-02: qty 1

## 2012-04-02 MED ORDER — AZITHROMYCIN 250 MG PO TABS
250.0000 mg | ORAL_TABLET | Freq: Every day | ORAL | Status: DC
  Administered 2012-04-03 – 2012-04-04 (×2): 250 mg via ORAL
  Filled 2012-04-02 (×2): qty 1

## 2012-04-02 MED ORDER — PROGESTERONE MICRONIZED 200 MG PO CAPS
200.0000 mg | ORAL_CAPSULE | Freq: Every day | ORAL | Status: DC
  Administered 2012-04-02 – 2012-04-03 (×2): 200 mg via VAGINAL
  Filled 2012-04-02 (×2): qty 1

## 2012-04-02 MED ORDER — HYDROXYPROGESTERONE CAPROATE 250 MG/ML IM OIL: 250.0000 mg | TOPICAL_OIL | INTRAMUSCULAR | Status: DC

## 2012-04-02 MED ORDER — PNEUMOCOCCAL VAC POLYVALENT 25 MCG/0.5ML IJ INJ
0.5000 mL | INJECTION | INTRAMUSCULAR | Status: AC
  Filled 2012-04-02: qty 0.5

## 2012-04-02 MED ORDER — SODIUM CHLORIDE 0.9 % IV SOLN
2.0000 g | Freq: Four times a day (QID) | INTRAVENOUS | Status: AC
  Administered 2012-04-02 – 2012-04-04 (×8): 2 g via INTRAVENOUS
  Filled 2012-04-02 (×8): qty 2000

## 2012-04-02 MED ORDER — AMOXICILLIN 250 MG PO CAPS
250.0000 mg | ORAL_CAPSULE | Freq: Three times a day (TID) | ORAL | Status: DC
  Filled 2012-04-02: qty 1

## 2012-04-02 NOTE — H&P (Signed)
NAME:  [name], [name] NO.:  MEDICAL RECORD NO.:  [id number]  LOCATION:                                 FACILITY:  PHYSICIAN:  Malachi Pro. Ambrose Mantle, M.D. DATE OF BIRTH:  July 07, 1985  DATE OF ADMISSION:  03/30/2012 DATE OF DISCHARGE:                             HISTORY & PHYSICAL   PRESENT ILLNESS:  26 year old black female, para 2-0-2-2, gravida 5 at [redacted] weeks gestation with an incompetent cervix for rescue cerclage.  On ultrasound, the patient had minimal cervix Dr. Ellyn Hack discussed the situation with maternal fetal medicine specialist, and she was advised to proceed with the rest cerclage.  PATIENT'S OBSTETRIC HISTORY:  She had 1 missed abortion.  Second pregnancy 24 weeks.  Dilatation to 1 cm.  Bed rest, delivery at term. Third pregnancy prophylactics cerclage, delivered at term.  Fourth pregnancy spontaneous abortion and this is her fifth pregnancy.  She has a history of trichomoniasis, gonorrhea,  and Chlamydia.  She has no significant medical history except incompetent cervix and sickle cell trait.  She has had a cholecystectomy, and she has had prior cerclage. She has been smoking.  She does not use smokeless tobacco, does not drink alcohol or use illicit drugs.  After admission to the hospital, Dr. Ellyn Hack took her to the operating room and under spinal anesthesia and IV sedation, Dr. Ellyn Hack performed a cerclage and placed 2 stitches. There was 100 mL of blood loss.  Findings were 16 week size uterus with membranes to the cervical os.  Dr. Ellyn Hack stated that she placed a Foley catheter into the uterus to pull the membranes up in and then was able to suture the cervix.  Postoperatively, the patient has done well.  She has only a scant amount of bleeding, and she is ready for discharge. Initial white count was [id number], hemoglobin 12, hematocrit 34.9, platelet count 247,000.  MRSA and PCR were negative.  Staph aureus negative. Followup hemoglobin 9,  hematocrit 25.8, and white count 10,100.  I suspect the drop in the hemoglobin is due to dilution from IV fluids. The patient shows no sign of blood loss.  FINAL DIAGNOSES:  Intrauterine pregnancy, 16-17 weeks with incompetent cervix.  Operation cerclage procedure by Dr. Ellyn Hack.  FINAL CONDITION:  Improved.  Instructions include our regular discharge instructions to include no vaginal entrance, basically bed rest until she has a chance to talk to Dr. Ellyn Hack on April 02, 2012.  I am giving her a prescription for Motrin  800 mg, 30 tablets, one,  three times a day as needed for discomfort and also to try to keep the uterus at rest.  She is also Delalutin once weekly.  She is to call Dr. Ellyn Hack in 2 days for further instructions but as of now, I have advised her to stay out of work.     Malachi Pro. Ambrose Mantle, M.D.    TFH/MEDQ  D:  03/31/2012  T:  03/31/2012  Job:  [id number]

## 2012-04-02 NOTE — Progress Notes (Signed)
Patient ID: [name], female   DOB: August 01, 1985, 26 y.o.   MRN: [id number]  26yo [id number] at 16+6 with incompetent cervix, s/p cerclage, readmitted with cervical change and contraction pain.  Have discussed POC with pt and MFM.  Pt declined to have cerclage clipped Sunday.  +FM, no LOF, no VB, no ctx.  D/w pt risks of infection and reasons to push for delivery  AFVSS WBC 11 this am gen NAD Abd soft, FNT  FHTs 150's toco doesn't c/o  SSE visually 2 cm, membranes at os, .5 cm thick SVE 1-2cm dilated, 90% effaced, membranes at os, ? Presenting part   48hr IV abx, then 5 days of oral abx 250mg  prometrium pv qhs FHTs q shift Transfer to [facility]

## 2012-04-02 NOTE — Progress Notes (Signed)
Post discharge review completed from dates of 03-30-12 through 03-31-12

## 2012-04-02 NOTE — Progress Notes (Signed)
Patient ID: [name], female   DOB: 1986/05/03, 26 y.o.   MRN: [id number] The pt is afebrile and she reports no pain. Dr. Ellyn Hack will see the pt and decide on management.

## 2012-04-02 NOTE — Progress Notes (Signed)
[name]  was seen today for an ultrasound appointment.  See full report in [facility].  Alpha Gula, MD  Ms. [name] underwent rescue cerclage on 10/11.  She was re admitted yesterday due to abdominal pain, vaginal bleedings and contractions.  Her abdominal pain has completely resolved and she currently denies uterine cramping. She has been afebrile throughout her hospital course.  Her WBC count this AM was 11.2 and she has no signs or symptoms of intraamniotic infection.  Ultrasound today shows U-shaped funneling with the cervix open to approximately 2 cm at the level of the cerclage stitch.  We had a brief discussion regarding prognosis for the fetus.  Given her current gestational age and current exam, the likelihood of achieving viability is low.  With exposed fetal membranes, she is at high risk for developing intra-amniotic infection or PROM.  We discussed amniocentesis - at this current time, I do not feel compelled to perform amniocentesis, but would have a low threshold should she develop an elevated WBC count or any other s/sx of chorioamnionitis.    Single IUP at 16 6/7 weeks s/p rescue cerclage on 10/11, readdmitted due to abdominal pain/ contractions  TVUS - cephalic presentation, U-shaped funneling with membranes at the external os.  The cervix is open to approximately 2 cm  1) Continue inpatient observation 2) Daily CBC while admitted - follow WBC count; would have low threshold for amniocentesis if leukocytosis or other s/sx of intraamniotic infection noted 3) Would remove cerclage if patient develops PROM or active labor- move toward delivery if clinical s/sx of chorioamnionitis 3) IV Ampicllin x 48 hours; Azithromycin (Z-pack)  - would transition of po Amoxicillin after initial 48 hours for full 7 day course of antibiotics 4) Vaginal progesterone supplementation 5) Once IV antibiotics complete - may discharge home with close outpatient follow up 6) Would readmit  at 23 weeks for betamethasone, inpatient management if this gestation is achieved

## 2012-04-03 LAB — CBC
Hemoglobin: 8.4 g/dL — ABNORMAL LOW (ref 12.0–15.0)
MCH: 29.1 pg (ref 26.0–34.0)
RBC: 2.89 MIL/uL — ABNORMAL LOW (ref 3.87–5.11)

## 2012-04-03 LAB — URINE CULTURE: Colony Count: >=100000

## 2012-04-03 NOTE — Progress Notes (Signed)
UR Chart review completed.  

## 2012-04-03 NOTE — Progress Notes (Addendum)
Patient ID: [name], female   DOB: Apr 20, 1986, 26 y.o.   MRN: [id number]  Pt w/o c/o's.  +FM, no LOF, no VB, no ctx; no c/o pressure  AF VSS gen NAD Abd soft, FNT  FHTs 150's  Pt on amp/amox and zithromax for exposed membranes, nightly 17 P OH, weekly delalutein, appreciate MFM consult/advice.  Will d/c after IV abx, readmit at 23 week. Daily CBC  Also treating for UTI with Macrobid for 7 days 17 wks today.

## 2012-04-04 LAB — CBC
HCT: 24.9 % — ABNORMAL LOW (ref 36.0–46.0)
MCV: 85.3 fL (ref 78.0–100.0)
RDW: 13.2 % (ref 11.5–15.5)
WBC: 9.5 10*3/uL (ref 4.0–10.5)

## 2012-04-04 MED ORDER — PROGESTERONE MICRONIZED 200 MG PO CAPS: 200.0000 mg | ORAL_CAPSULE | Freq: Every day | ORAL | Status: DC

## 2012-04-04 MED ORDER — AZITHROMYCIN 250 MG PO TABS: ORAL_TABLET | ORAL | Status: DC

## 2012-04-04 MED ORDER — AMOXICILLIN 250 MG PO CAPS: 250.0000 mg | ORAL_CAPSULE | Freq: Three times a day (TID) | ORAL | Status: DC

## 2012-04-04 MED ORDER — NITROFURANTOIN MONOHYD MACRO 100 MG PO CAPS: 100.0000 mg | ORAL_CAPSULE | Freq: Two times a day (BID) | ORAL | Status: DC

## 2012-04-04 NOTE — Discharge Summary (Signed)
Obstetric Discharge Summary Reason for Admission: advanced cervical dilitation, incompetent cervix Prenatal Procedures: ultrasound and IV antibiotics, MFM consult, SW consult. Intrapartum Procedures: N/A Postpartum Procedures: N/A Complications-Operative and Postpartum: N/A Hemoglobin  Date Value Range Status  04/04/2012 8.5* 12.0 - 15.0 g/dL Final     HCT  Date Value Range Status  04/04/2012 24.9* 36.0 - 46.0 % Final    Physical Exam:  General: alert and no distress Lochia: appropriate Uterine Fundus: firm  Discharge Diagnoses: incompetent cervix, s/p rescue cerclage, IV abx, UTI  Discharge Information: Date: 04/04/2012 Activity: pelvic rest and BED REST with bathroom privileges Diet: routine Medications: PNV and vaginal prometrium, amoxicillin, zithromax, macrobid, delalutein Condition: guarded Instructions: BW CBC, weekly delalutein, BEDREST; readmit if 23 week for BMZ Discharge to: home Follow-up Information    Follow up with BOVARD,Deryk Bozman, MD. (As scheduled, tomorrow at 10am)    Contact information:   [address] [place] [address] [phone]         Pt voices understanding to preterm labor/ SAB precautions, states can bedrest  BOVARD,Jeffrie Lofstrom 04/04/2012, 9:04 AM

## 2012-04-04 NOTE — Progress Notes (Signed)
Patient ID: [name], female   DOB: Sep 25, 1985, 26 y.o.   MRN: [id number]  26yo [id number] at 17+ wk, s/p rescue cerclage at 16+ wk readmitted with more cervical dilitation and cramping.  Found to be cm dilated with membranes at os.  Has received abx for exposed membranes, also vaginal progesterone.  Will readmit at viability.  Until BW CBC.  AFVSS gen NAD Abd soft, FNT  CBC WBC = 9.1  D/C today.  D/c with amox, zithromax, vaginal progesterone to continue 17P IJ on Thursday  macrobid to finish course.

## 2012-04-04 NOTE — Clinical Social Work Psychosocial (Signed)
    Clinical Social Work Department BRIEF PSYCHOSOCIAL ASSESSMENT 04/04/2012  Patient:  [name], [name]     Account Number:  [id number]     Admit date:  04/01/2012  Clinical Social Worker:  Andy Gauss  Date/Time:  04/04/2012 09:27 AM  Referred by:  Physician  Date Referred:  04/04/2012 Referred for  Other - See comment   Other Referral:   ?able support   Interview type:  Patient Other interview type:    PSYCHOSOCIAL DATA Living Status:  WITH MINOR CHILDREN Admitted from facility:   Level of care:   Primary support name:   Primary support relationship to patient:  FRIEND Degree of support available:   Involved    CURRENT CONCERNS Current Concerns  Other - See comment   Other Concerns:    SOCIAL WORK ASSESSMENT / PLAN Sw referral received to assess pt's support system.  Pt is 17 weeks, single parent of 2 small children (77 & 24 years old) and on bed rest.  Pt currently lives with her children.  Upon discharge, pt's friend plans to move in with  her to help with the children.  FOB is not involved. Pt is employed at the [employer] however on leave of absence now.  She denies depression but does admit to feeling stressed about being placed on bed rest and the financial strain it could cause.  Sw encouraged pt to apply for financial resources at DSS.  Pt thanked Sw for resources. Pt has transportation home and seems to be appropriate.  No barriers to discharge.   Assessment/plan status:  No Further Intervention Required Other assessment/ plan:   Information/referral to community resources:   DSS for financial assistance programs.    PATIENT'S/FAMILY'S RESPONSE TO PLAN OF CARE: Pt was receptive to resources provided and reason for consult.

## 2012-04-06 ENCOUNTER — Ambulatory Visit (HOSPITAL_COMMUNITY)
Admission: AD | Admit: 2012-04-06 | Payer: Medicaid Other | Source: Ambulatory Visit | Admitting: Obstetrics and Gynecology

## 2012-04-06 ENCOUNTER — Encounter (HOSPITAL_COMMUNITY): Admission: AD | Payer: Self-pay | Source: Ambulatory Visit

## 2012-04-06 ENCOUNTER — Encounter (HOSPITAL_COMMUNITY): Payer: Self-pay | Admitting: *Deleted

## 2012-04-06 ENCOUNTER — Observation Stay (HOSPITAL_COMMUNITY)
Admission: AD | Admit: 2012-04-06 | Discharge: 2012-04-07 | Disposition: A | Discharge status: Home / Self Care | Payer: Medicaid Other | Source: Ambulatory Visit | Attending: Obstetrics and Gynecology | Admitting: Obstetrics and Gynecology

## 2012-04-06 DIAGNOSIS — O021 Missed abortion: Principal | ICD-10-CM | POA: Insufficient documentation

## 2012-04-06 DIAGNOSIS — O3430 Maternal care for cervical incompetence, unspecified trimester: Secondary | ICD-10-CM | POA: Insufficient documentation

## 2012-04-06 DIAGNOSIS — O0330 Unspecified complication following incomplete spontaneous abortion: Secondary | ICD-10-CM

## 2012-04-06 LAB — CBC
HCT: 27.2 % — ABNORMAL LOW (ref 36.0–46.0)
Hemoglobin: 9.3 g/dL — ABNORMAL LOW (ref 12.0–15.0)
MCV: 85.5 fL (ref 78.0–100.0)
Platelets: 256 10*3/uL (ref 150–400)
RBC: 3.18 MIL/uL — ABNORMAL LOW (ref 3.87–5.11)
WBC: 9.7 10*3/uL (ref 4.0–10.5)

## 2012-04-06 LAB — TYPE AND SCREEN: ABO/RH(D): O POS

## 2012-04-06 SURGERY — CERCLAGE, CERVIX, VAGINAL APPROACH
Anesthesia: General

## 2012-04-06 MED ORDER — BUTORPHANOL TARTRATE 1 MG/ML IJ SOLN: 1.0000 mg | INTRAMUSCULAR | Status: DC | PRN

## 2012-04-06 MED ORDER — PROMETHAZINE HCL 25 MG/ML IJ SOLN: 12.5000 mg | Freq: Once | INTRAMUSCULAR | Status: AC | PRN

## 2012-04-06 MED ORDER — CITRIC ACID-SODIUM CITRATE 334-500 MG/5ML PO SOLN
30.0000 mL | ORAL | Status: DC | PRN
  Administered 2012-04-07: 30 mL via ORAL
  Filled 2012-04-06: qty 15

## 2012-04-06 MED ORDER — IBUPROFEN 600 MG PO TABS: 600.0000 mg | ORAL_TABLET | Freq: Four times a day (QID) | ORAL | Status: DC | PRN

## 2012-04-06 MED ORDER — MISOPROSTOL 25 MCG QUARTER TABLET: 8000.0000 ug | ORAL_TABLET | Freq: Once | ORAL | Status: DC | PRN

## 2012-04-06 MED ORDER — MISOPROSTOL 200 MCG PO TABS
600.0000 ug | ORAL_TABLET | ORAL | Status: DC
  Administered 2012-04-06: 600 ug via ORAL

## 2012-04-06 MED ORDER — NALBUPHINE SYRINGE 5 MG/0.5 ML
5.0000 mg | INJECTION | Freq: Once | INTRAMUSCULAR | Status: AC | PRN
  Filled 2012-04-06: qty 0.5

## 2012-04-06 MED ORDER — ONDANSETRON HCL 4 MG/2ML IJ SOLN: 4.0000 mg | Freq: Four times a day (QID) | INTRAMUSCULAR | Status: DC | PRN

## 2012-04-06 MED ORDER — LACTATED RINGERS IV SOLN
INTRAVENOUS | Status: DC
  Administered 2012-04-06: 23:00:00 via INTRAVENOUS

## 2012-04-06 MED ORDER — SODIUM CHLORIDE 0.9 % IV SOLN
INTRAVENOUS | Status: DC
  Administered 2012-04-06: 23:00:00 via INTRAVENOUS

## 2012-04-06 MED ORDER — MISOPROSTOL 200 MCG PO TABS
800.0000 ug | ORAL_TABLET | Freq: Once | ORAL | Status: AC | PRN
  Filled 2012-04-06: qty 4

## 2012-04-06 MED ORDER — OXYCODONE-ACETAMINOPHEN 5-325 MG PO TABS: 1.0000 | ORAL_TABLET | ORAL | Status: DC | PRN

## 2012-04-06 MED ORDER — ACETAMINOPHEN 325 MG PO TABS: 650.0000 mg | ORAL_TABLET | ORAL | Status: DC | PRN

## 2012-04-06 NOTE — Treatment Plan (Signed)
Report called to [employer] [facility] RN birthing suites, patient may come to 160

## 2012-04-06 NOTE — MAU Note (Signed)
Dr. Richardson at bedside.

## 2012-04-06 NOTE — MAU Provider Note (Signed)
Chief Complaint: No chief complaint on file.  First Provider Initiated Contact with Patient 04/06/12 2102      SUBJECTIVE HPI: [name] is a 26 y.o. G3P2002 at [redacted]w[redacted]d by LMP who presents to maternity admissions by EMS with delivery at home. Patient had a rescue cerclage placed 03/30/12 by Dr. Ellyn Hack and had been admitted for cervical dilation and bulging of membranes 04/01/2012 through 04/04/2012. Received antibiotics and was on bedrest.  Baby was transported by separate ambulance from mom do to mother's severe distress. Heart rate was present upon arrival, but he passed away soon afterward. See nurse's documentation. Neonatologist was present to assess his gestational age in case he had been more mature than what was reported by EMS. Neonatologist agreed that he appeared to be 17 weeks and upon review chart it was determined that this is a well dated pregnancy. Patient denies fever, chills. Reported nausea en route to the hospital and was given Zofran. Patient appears to be in some discomfort, but denies pain medication.  Past Medical History  Diagnosis Date  . 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   OB History    Grav Para Term Preterm Abortions TAB SAB Ect Mult Living   3 2 2       2      # Outc Date GA Lbr Len/2nd Wgt Sex Del Anes PTL Lv   1 TRM 7/08 [redacted]w[redacted]d  7lb8oz(3.402kg) M SVD EPI Yes Yes   2 TRM 2/11 [redacted]w[redacted]d  7lb5oz(3.317kg) F SVD EPI  Yes   Comments: cerclage at 14 wks   3 CUR              Past Surgical History  Procedure Date  . Cholecystectomy 2008  . Cervical cerclage   . Cervical cerclage 03/30/2012    Procedure: CERCLAGE CERVICAL;  Surgeon: Sherron Monday, MD;  Location: [facility];  Service: Gynecology;  Laterality: N/A;   History   Social History  . Marital Status: Single    Spouse Name: N/A    Number of Children: N/A  . Years of Education: N/A   Occupational History  . Not on file.   Social History Main Topics  . Smoking  status: Former Smoker    Types: Cigarettes  . Smokeless tobacco: Never Used  . Alcohol Use: No  . Drug Use: No  . Sexually Active: No   Other Topics Concern  . Not on file   Social History Narrative  . No narrative on file   No current facility-administered medications on file prior to encounter.   Current Outpatient Prescriptions on File Prior to Encounter  Medication Sig Dispense Refill  . amoxicillin (AMOXIL) 250 MG capsule Take 1 capsule (250 mg total) by mouth every 8 (eight) hours.  15 capsule  0  . azithromycin (ZITHROMAX) 250 MG tablet One daily x 3 days  3 each  0  . nitrofurantoin, macrocrystal-monohydrate, (MACROBID) 100 MG capsule Take 1 capsule (100 mg total) by mouth every 12 (twelve) hours.  10 capsule  0  . Prenatal Vit-Fe Fumarate-FA (PRENATAL MULTIVITAMIN) TABS Take 1 tablet by mouth daily.      . progesterone (PROMETRIUM) 200 MG capsule Take 1 capsule (200 mg total) by mouth at bedtime.  30 capsule  5  . ondansetron (ZOFRAN) 8 MG tablet Take 8 mg by mouth every 8 (eight) hours as needed. For nausea      . oxyCODONE-acetaminophen (PERCOCET/ROXICET) 5-325 MG per tablet Take 1 tablet by mouth  every 4 (four) hours as needed for pain.  20 tablet  0   No Known Allergies  ROS: Pertinent items in HPI  OBJECTIVE Blood pressure 134/67, pulse 116, resp. rate 18, last menstrual period 12/03/2011. GENERAL: Well-developed, well-nourished female in mild distress. Flat affect.  HEENT: Normocephalic HEART:  tachycardic RESP: normal effort ABDOMEN: Soft, non-tender,  EXTREMITIES: Nontender, no edema NEURO: Alert and oriented SPECULUM EXAM: deferred  Umbilical cord visible at vaginal opening. Scant blood on towels and bedding under patient during EMS transport. Scant bleeding from vagina.  LAB RESULTS No results found for this or any previous visit (from the past 24 hour(s)).  IMAGING  MAU COURSE 2045: Attempted to deliver placenta with gentle cord traction without  success. Dr. Senaida Ores notified of patient delivery at home and arrival to MAU. Will come to see patient.  CBC, type and screen, Q10 minute vital signs ordered. Offered medication for pain and nausea. Patient declined. We'll monitor patient's bleeding closely until Dr. Berenda Morale arrival. 2115:Dr. Senaida Ores at bedside. Assuming care of patient.  ASSESSMENT 17 week 3 day incomplete miscarriage due to incompetent cervix.   PLAN Dr. Senaida Ores assuming care of patient.  Dorathy Kinsman, CNM 04/06/2012  9:24 PM    Pt seen and appropriately grieving.  SSE done and cerclage x 2 removed without difficulty.  Minimal bleeding noted.  Traction on cord and attempt to tease out with ring forcep unsuccessful.  Appears still attached to uterine wall.  D/w pt her options and we agree to try cytotec to get placenta out and admit to observation.  Will send to L&D as AICU full.  of cytotec moistened and placed in posterior fornix.  Pt had full meal of chicken and rice at 7-730pm.  Will keep NPO until we see if placenta passes and does not need a D&C.  Pt given emotional support and held baby.  Her friend and children are here now.

## 2012-04-06 NOTE — MAU Note (Signed)
Ems arrival

## 2012-04-07 ENCOUNTER — Observation Stay (HOSPITAL_COMMUNITY): Payer: Medicaid Other | Admitting: Anesthesiology

## 2012-04-07 ENCOUNTER — Encounter (HOSPITAL_COMMUNITY): Payer: Self-pay | Admitting: Anesthesiology

## 2012-04-07 ENCOUNTER — Encounter (HOSPITAL_COMMUNITY): Payer: Self-pay | Admitting: *Deleted

## 2012-04-07 ENCOUNTER — Encounter (HOSPITAL_COMMUNITY)
Admission: AD | Disposition: A | Discharge status: Home / Self Care | Payer: Self-pay | Source: Ambulatory Visit | Attending: Obstetrics and Gynecology

## 2012-04-07 HISTORY — PX: DILATION AND EVACUATION: SHX1459

## 2012-04-07 LAB — CBC
HCT: 21.3 % — ABNORMAL LOW (ref 36.0–46.0)
Hemoglobin: 7.3 g/dL — ABNORMAL LOW (ref 12.0–15.0)
MCHC: 34.3 g/dL (ref 30.0–36.0)
RBC: 2.49 MIL/uL — ABNORMAL LOW (ref 3.87–5.11)
WBC: 10.7 10*3/uL — ABNORMAL HIGH (ref 4.0–10.5)

## 2012-04-07 SURGERY — DILATION AND EVACUATION, UTERUS
Anesthesia: Spinal | Site: Vagina | Wound class: Clean Contaminated

## 2012-04-07 MED ORDER — OXYCODONE-ACETAMINOPHEN 5-325 MG PO TABS
1.0000 | ORAL_TABLET | ORAL | Status: DC | PRN
  Administered 2012-04-07: 2 via ORAL
  Filled 2012-04-07: qty 2

## 2012-04-07 MED ORDER — BENZOCAINE-MENTHOL 20-0.5 % EX AERO
1.0000 "application " | INHALATION_SPRAY | CUTANEOUS | Status: DC | PRN
  Administered 2012-04-07: 1 via TOPICAL
  Filled 2012-04-07: qty 56

## 2012-04-07 MED ORDER — PHENYLEPHRINE HCL 10 MG/ML IJ SOLN
INTRAMUSCULAR | Status: DC | PRN
  Administered 2012-04-07: 80 ug via INTRAVENOUS
  Administered 2012-04-07: 40 ug via INTRAVENOUS
  Administered 2012-04-07: 80 ug via INTRAVENOUS

## 2012-04-07 MED ORDER — LACTATED RINGERS IV SOLN: INTRAVENOUS | Status: DC

## 2012-04-07 MED ORDER — ONDANSETRON HCL 4 MG/2ML IJ SOLN: 4.0000 mg | INTRAMUSCULAR | Status: DC | PRN

## 2012-04-07 MED ORDER — LIDOCAINE IN DEXTROSE 5-7.5 % IV SOLN
INTRAVENOUS | Status: AC
  Filled 2012-04-07: qty 2

## 2012-04-07 MED ORDER — FENTANYL CITRATE 0.05 MG/ML IJ SOLN
25.0000 ug | INTRAMUSCULAR | Status: DC | PRN
  Administered 2012-04-07 (×2): 50 ug via INTRAVENOUS

## 2012-04-07 MED ORDER — SENNOSIDES-DOCUSATE SODIUM 8.6-50 MG PO TABS: 2.0000 | ORAL_TABLET | Freq: Every day | ORAL | Status: DC

## 2012-04-07 MED ORDER — LIDOCAINE IN DEXTROSE 5-7.5 % IV SOLN
INTRAVENOUS | Status: DC | PRN
  Administered 2012-04-07: 50 mg via INTRATHECAL

## 2012-04-07 MED ORDER — DIPHENHYDRAMINE HCL 25 MG PO CAPS: 25.0000 mg | ORAL_CAPSULE | Freq: Four times a day (QID) | ORAL | Status: DC | PRN

## 2012-04-07 MED ORDER — DOCUSATE SODIUM 100 MG PO CAPS: 100.0000 mg | ORAL_CAPSULE | Freq: Two times a day (BID) | ORAL | Status: DC | PRN

## 2012-04-07 MED ORDER — KETOROLAC TROMETHAMINE 30 MG/ML IJ SOLN: 15.0000 mg | Freq: Once | INTRAMUSCULAR | Status: DC | PRN

## 2012-04-07 MED ORDER — ONDANSETRON HCL 4 MG PO TABS: 4.0000 mg | ORAL_TABLET | ORAL | Status: DC | PRN

## 2012-04-07 MED ORDER — FENTANYL CITRATE 0.05 MG/ML IJ SOLN
INTRAMUSCULAR | Status: AC
  Administered 2012-04-07: 50 ug via INTRAVENOUS
  Filled 2012-04-07: qty 2

## 2012-04-07 MED ORDER — LACTATED RINGERS IV SOLN
INTRAVENOUS | Status: DC | PRN
  Administered 2012-04-07: via INTRAVENOUS

## 2012-04-07 MED ORDER — IBUPROFEN 600 MG PO TABS
600.0000 mg | ORAL_TABLET | Freq: Four times a day (QID) | ORAL | Status: DC
  Administered 2012-04-07: 600 mg via ORAL
  Filled 2012-04-07: qty 1

## 2012-04-07 MED ORDER — DIBUCAINE 1 % RE OINT: 1.0000 "application " | TOPICAL_OINTMENT | RECTAL | Status: DC | PRN

## 2012-04-07 MED ORDER — ZOLPIDEM TARTRATE 5 MG PO TABS: 5.0000 mg | ORAL_TABLET | Freq: Every evening | ORAL | Status: DC | PRN

## 2012-04-07 MED ORDER — SODIUM CHLORIDE 0.9 % IV SOLN
3.0000 g | INTRAVENOUS | Status: DC | PRN
  Administered 2012-04-07: 3 g via INTRAVENOUS

## 2012-04-07 MED ORDER — IBUPROFEN 600 MG PO TABS: 600.0000 mg | ORAL_TABLET | Freq: Four times a day (QID) | ORAL | Status: DC

## 2012-04-07 MED ORDER — SODIUM CHLORIDE 0.9 % IV SOLN
3.0000 g | Freq: Once | INTRAVENOUS | Status: DC
  Filled 2012-04-07: qty 3

## 2012-04-07 MED ORDER — SIMETHICONE 80 MG PO CHEW: 80.0000 mg | CHEWABLE_TABLET | ORAL | Status: DC | PRN

## 2012-04-07 MED ORDER — PHENYLEPHRINE 40 MCG/ML (10ML) SYRINGE FOR IV PUSH (FOR BLOOD PRESSURE SUPPORT)
PREFILLED_SYRINGE | INTRAVENOUS | Status: AC
  Filled 2012-04-07: qty 10

## 2012-04-07 MED ORDER — OXYCODONE-ACETAMINOPHEN 5-325 MG PO TABS: 1.0000 | ORAL_TABLET | ORAL | Status: DC | PRN

## 2012-04-07 MED ORDER — MIDAZOLAM HCL 2 MG/2ML IJ SOLN
INTRAMUSCULAR | Status: AC
  Filled 2012-04-07: qty 2

## 2012-04-07 MED ORDER — SODIUM CHLORIDE 0.9 % IJ SOLN: 3.0000 mL | INTRAMUSCULAR | Status: DC | PRN

## 2012-04-07 MED ORDER — WITCH HAZEL-GLYCERIN EX PADS: 1.0000 "application " | MEDICATED_PAD | CUTANEOUS | Status: DC | PRN

## 2012-04-07 MED ORDER — MIDAZOLAM HCL 5 MG/5ML IJ SOLN
INTRAMUSCULAR | Status: DC | PRN
  Administered 2012-04-07 (×2): 1 mg via INTRAVENOUS

## 2012-04-07 MED ORDER — OXYTOCIN 40 UNITS IN LACTATED RINGERS INFUSION - SIMPLE MED
INTRAVENOUS | Status: AC
  Filled 2012-04-07: qty 1000

## 2012-04-07 SURGICAL SUPPLY — 19 items
CATH ROBINSON RED A/P 16FR (CATHETERS) ×2 IMPLANT
CLOTH BEACON ORANGE TIMEOUT ST (SAFETY) ×2 IMPLANT
DECANTER SPIKE VIAL GLASS SM (MISCELLANEOUS) IMPLANT
GLOVE BIO SURGEON STRL SZ 6.5 (GLOVE) ×4 IMPLANT
GOWN STRL REIN XL XLG (GOWN DISPOSABLE) ×4 IMPLANT
KIT BERKELEY 1ST TRIMESTER 3/8 (MISCELLANEOUS) IMPLANT
NEEDLE SPNL 22GX3.5 QUINCKE BK (NEEDLE) IMPLANT
NS IRRIG 1000ML POUR BTL (IV SOLUTION) ×2 IMPLANT
PACK VAGINAL MINOR WOMEN LF (CUSTOM PROCEDURE TRAY) ×2 IMPLANT
PAD OB MATERNITY 4.3X12.25 (PERSONAL CARE ITEMS) ×2 IMPLANT
PAD PREP 24X48 CUFFED NSTRL (MISCELLANEOUS) ×2 IMPLANT
SET BERKELEY SUCTION TUBING (SUCTIONS) IMPLANT
SYR CONTROL 10ML LL (SYRINGE) IMPLANT
TOWEL OR 17X24 6PK STRL BLUE (TOWEL DISPOSABLE) ×4 IMPLANT
VACURETTE 10 RIGID CVD (CANNULA) IMPLANT
VACURETTE 7MM CVD STRL WRAP (CANNULA) IMPLANT
VACURETTE 8 RIGID CVD (CANNULA) IMPLANT
VACURETTE 9 RIGID CVD (CANNULA) IMPLANT
WATER STERILE IRR 1000ML POUR (IV SOLUTION) IMPLANT

## 2012-04-07 NOTE — Anesthesia Postprocedure Evaluation (Signed)
  Anesthesia Post-op Note  Patient: [name]  Procedure(s) Performed: Procedure(s) (LRB) with comments: DILATATION AND EVACUATION (N/A)  Patient Location: [facility]  Anesthesia Type: Spinal  Level of Consciousness: awake  Airway and Oxygen Therapy: Patient Spontanous Breathing  Post-op Pain: mild  Post-op Assessment: Patient's Cardiovascular Status Stable and Respiratory Function Stable  Post-op Vital Signs: stable  Complications: No apparent anesthesia complications

## 2012-04-07 NOTE — Op Note (Signed)
Operative note  Preoperative diagnosis Incompetent cervix with failed rescue cerclage and delivery of 17+ week fetus Retained placenta Heavy vaginal bleeding  Postoperative diagnosis Same  Procedure Curettage and manual removal of placenta  Surgeon Dr. Huel Cote  Anesthesia Spinal  Fluids Total estimated blood loss for delivery 400 cc IV fluids 800 cc LR Urine output straight catheter approximately 100 cc prior to procedure  Findings Cervix was dilated 3-4 cm and the uterine fundus easily entered. The placenta was first manually removed in fragments with some remaining tissue palpable in the fundus. This was removed with a banjo curette.  Pathology Placenta sent  Procedure note After informed consent was obtained from the patient she was taken to the operating room where a spinal anesthesia was obtained by Dr. Larey Seat without difficulty. The patient was then prepped and draped in the normal sterile fashion in the dorsal lithotomy position and an appropriate time out performed. Patient was noted to have heavy vaginal bleeding with several large clots passing after she was placed in position. Once the patient was draped in a manual exploration of the uterine fundus was performed and the placenta was teased out and separated from the uterine wall manually. The bulk of the placenta was delivered in this fashion, but there were still fragments palpable in the upper fundus. A speculum was then placed in the vagina and the anterior lip of the cervix grasped with a ring forcep. A banjo curette was then easily introduced into the uterine fundus and in several passes a moderate amount of placental tissue obtained. When no further tissue was forthcoming the speculum was removed and manual exploration again performed. At this point no further fragments were felt and the uterus contracted well. The patient's bleeding was observed for an additional 5-10 minutes and appeared minimal.  Therefore all instruments and sponges were removed from the vagina and the patient was taken to the recovery room in good condition.

## 2012-04-07 NOTE — Progress Notes (Signed)
Patient ID: [name], female   DOB: Feb 10, 1986, 26 y.o.   MRN: [id number] Pt s/p 17+week loss from incompetent cervix and failed rescue cerclage D&E for retained placenta with hemorrhage Blood count down to 7.3 but pt tolerating fine and ambulating, minimal VB now We discussed using supplemental iron OTC She has made arrangements with the funeral home.  Her friend will stay with her and the kids. Emotional support given, discussed with pt Heartstrings  D/c home f/u in 2 weeks

## 2012-04-07 NOTE — Anesthesia Postprocedure Evaluation (Signed)
Anesthesia Post Note  Patient: Geophysical data processor  Procedure(s) Performed: Procedure(s) (LRB): DILATATION AND EVACUATION (N/A)  Anesthesia type: Spinal  Patient location: PACU  Post pain: Pain level controlled  Post assessment: Post-op Vital signs reviewed  Last Vitals:  Filed Vitals:   04/07/12 0224  BP: 116/67  Pulse: 86  Temp: 37.1 C  Resp: 16    Post vital signs: Reviewed  Level of consciousness: awake  Complications: No apparent anesthesia complications

## 2012-04-07 NOTE — Progress Notes (Signed)
Patient ID: [name], female   DOB: 1985/09/25, 26 y.o.   MRN: [id number] CTSP for increased VB  Pt passing several large clots and bleeding moderate to heavy.  Vaginal exam with placenta still well up inside the uterus.  Traction attempted on cord, but just avulsed with no movement of placenta.  D/w pt in light of bleeding I feel we should proceed to OR for removal.  Dr. Rodman Pickle consulted and feels she can safely do a spinal since had her full meal at 730pm. Pt agrees to proceed after procedure described in detail.  Will proceed as soon as OR ready.

## 2012-04-07 NOTE — Transfer of Care (Signed)
Immediate Anesthesia Transfer of Care Note  Patient: [name]  Procedure(s) Performed: Procedure(s) (LRB) with comments: DILATATION AND EVACUATION (N/A)  Patient Location: PACU  Anesthesia Type: Spinal  Level of Consciousness: awake, alert  and oriented  Airway & Oxygen Therapy: Patient Spontanous Breathing  Post-op Assessment: Report given to PACU RN  Post vital signs: Reviewed and stable  Complications: No apparent anesthesia complications

## 2012-04-07 NOTE — Addendum Note (Signed)
Addendum  created 04/07/12 0744 by Renford Dills, CRNA   Modules edited:Notes Section

## 2012-04-07 NOTE — Progress Notes (Signed)
Dr. Senaida Ores at bedside; placenta still retained and pt bleeding heavily and passing clots. Informed pt of need to perform a D&C now rather than wait until the morning. Pt agreeable and consent form signed.

## 2012-04-07 NOTE — Anesthesia Procedure Notes (Signed)
Spinal  Patient location during procedure: OR Start time: 04/07/2012 12:39 AM Staffing Performed by: anesthesiologist  Preanesthetic Checklist Completed: patient identified, site marked, surgical consent, pre-op evaluation, timeout performed, IV checked, risks and benefits discussed and monitors and equipment checked Spinal Block Patient position: sitting Prep: site prepped and draped and DuraPrep Patient monitoring: blood pressure, continuous pulse ox and heart rate Approach: midline Location: L4-5 Injection technique: single-shot Needle Needle type: Sprotte  Needle gauge: 24 G Needle length: 9 cm Assessment Sensory level: T8 Additional Notes Clear free flow CSF on first attempt.  No paresthesia.  Patient tolerated procedure well.  Jasmine December, MD

## 2012-04-07 NOTE — Brief Op Note (Signed)
04/06/2012 - 04/07/2012  1:02 AM  PATIENT:  Linus Mako Camper  26 y.o. female  PRE-OPERATIVE DIAGNOSIS:  Retained Placenta  POST-OPERATIVE DIAGNOSIS:  Retained Placenta  PROCEDURE:  Procedure(s) (LRB) with comments: DILATATION AND EVACUATION (N/A)  SURGEON:  Surgeon(s) and Role:    * Oliver Pila, MD - Primary   ANESTHESIA:   spinal  EBL:  Total I/O In: 500 [I.V.:500] Out: -   BLOOD ADMINISTERED:none  DRAINS: none   LOCAL MEDICATIONS USED:  NONE  SPECIMEN: Placenta DISPOSITION OF SPECIMEN:  PATHOLOGY  COUNTS:  YES  TOURNIQUET:  * No tourniquets in log *  DICTATION: .Dragon Dictation  PLAN OF CARE: Admit for overnight observation  PATIENT DISPOSITION:  PACU - hemodynamically stable.

## 2012-04-07 NOTE — Discharge Summary (Signed)
Physician Discharge Summary  Patient ID: [name] MRN: [id number] DOB/AGE: September 25, 1985 26 y.o.  Admit date: 04/06/2012 Discharge date: 04/07/2012  Admission Diagnoses: Incompetent Cervix                                        Fetal loss at 17+weeks       Retained placenta with hemorrhage  Discharge Diagnoses: same   Discharged Condition: good  Hospital Course: Pt arrived by EMS after passing her fetus at home.  On arrival the placenta was still retained, but no active bleeding.  She had eaten a full meal and was admitted for cytotec to see if placenta would pass.  About 3 hours after first cytotec she began to bleed heavily but placenta would not deliver, so she was take to the OR.  Under spinal anesthesia the placenta was removed manually and with banjo curetage.  She was admitted for observation the rest of the night and did well with no further heavy bleeding.  Her d/c hemoglobin was 7.3 from 9.3 and she was ambulating fine.  She was given emotional support and did hold the baby following delivery.  Pictures were made for her, and she made arrangements with a funeral home for cremation.   Treatments: DIlation and curettage of retained placenta Discharge Exam: Blood pressure 106/52, pulse 74, temperature 98.3 F (36.8 C), temperature source Oral, resp. rate 16, height 5\' 8"  (1.727 m), weight 94.938 kg (209 lb 4.8 oz), last menstrual period 12/03/2011, SpO2 100.00%, unknown if currently breastfeeding. General appearance: alert and cooperative GI: soft, non-tender; bowel sounds normal; no masses,  no organomegaly  Disposition: 01-Home or Self Care  Discharge Orders    Future Orders Please Complete By Expires   Diet - low sodium heart healthy      Discharge instructions      Comments:   Nothing in vagina for 6 weeks.  No sex tampons or douching  Call with fever or heavy bleeding       Medication List     As of 04/07/2012 11:14 AM    STOP taking these medications        amoxicillin 250 MG capsule   Commonly known as: AMOXIL      azithromycin 250 MG tablet   Commonly known as: ZITHROMAX      COLACE 100 MG capsule   Generic drug: docusate sodium      nitrofurantoin (macrocrystal-monohydrate) 100 MG capsule   Commonly known as: MACROBID      ondansetron 8 MG tablet   Commonly known as: ZOFRAN      prenatal multivitamin Tabs      progesterone 200 MG capsule   Commonly known as: PROMETRIUM      TAKE these medications         ibuprofen 600 MG tablet   Commonly known as: ADVIL,MOTRIN   Take 1 tablet (600 mg total) by mouth every 6 (six) hours.      oxyCODONE-acetaminophen 5-325 MG per tablet   Commonly known as: PERCOCET/ROXICET   Take 1-2 tablets by mouth every 3 (three) hours as needed (moderate - severe pain).           Follow-up Information    Follow up with Oliver Pila, MD. Schedule an appointment as soon as possible for a visit in 2 weeks.   Contact information:   [address], [address] [place]  [address] [phone]          Signed: Oliver Pila 04/07/2012, 11:14 AM

## 2012-04-07 NOTE — Anesthesia Preprocedure Evaluation (Signed)
Anesthesia Evaluation  Patient identified by MRN, date of birth, ID band Patient awake    Reviewed: Allergy & Precautions, H&P , NPO status , Patient's Chart, lab work & pertinent test results, reviewed documented beta blocker date and time   History of Anesthesia Complications Negative for: history of anesthetic complications  Airway Mallampati: III TM Distance: >3 FB Neck ROM: full    Dental  (+) Teeth Intact   Pulmonary  breath sounds clear to auscultation        Cardiovascular negative cardio ROS  Rhythm:regular Rate:Normal     Neuro/Psych negative neurological ROS  negative psych ROS   GI/Hepatic negative GI ROS, Neg liver ROS,   Endo/Other  negative endocrine ROS  Renal/GU negative Renal ROS  negative genitourinary   Musculoskeletal   Abdominal   Peds  Hematology  (+) anemia ,   Anesthesia Other Findings Last ate spaghetti at 1:40 pm  Reproductive/Obstetrics (+) Pregnancy (incompetent cervix, delivered 17 week fetus, retained placenta)                           Anesthesia Physical  Anesthesia Plan  ASA: II and Emergent  Anesthesia Plan: Spinal   Post-op Pain Management:    Induction:   Airway Management Planned:   Additional Equipment:   Intra-op Plan:   Post-operative Plan:   Informed Consent: I have reviewed the patients History and Physical, chart, labs and discussed the procedure including the risks, benefits and alternatives for the proposed anesthesia with the patient or authorized representative who has indicated his/her understanding and acceptance.     Plan Discussed with: CRNA and Surgeon  Anesthesia Plan Comments:         Anesthesia Quick Evaluation

## 2012-04-07 NOTE — H&P (Signed)
  Pt well-known to our practice with recent rescue cerclage for incompetent cervix at 17+weeks.  Pt had been d/c to home for bedrest and while eating dinner, sneezed and felt water break with baby coming out immediately.  She called EMS and arrived to MAU with placenta still retained. Please see full H&P in NP note from pt arrival.

## 2012-04-07 NOTE — Progress Notes (Addendum)
Instructions given to patient at bedside.  Medications follow up appointments and Taking Care of Yourself pamphlet discussed.  No questions at this time.  Patient left unit in wheelchair in stable condition with personal belongings.  Osvaldo Angst, RN-------------

## 2012-04-09 ENCOUNTER — Encounter (HOSPITAL_COMMUNITY): Payer: Self-pay | Admitting: Obstetrics and Gynecology

## 2012-04-09 NOTE — Progress Notes (Signed)
Post discharge review completed. 

## 2012-04-10 ENCOUNTER — Encounter (HOSPITAL_COMMUNITY): Payer: Self-pay | Admitting: *Deleted

## 2012-04-16 ENCOUNTER — Encounter (HOSPITAL_COMMUNITY): Payer: Self-pay | Admitting: *Deleted

## 2012-04-19 NOTE — Discharge Summary (Signed)
[name], [name] NO.:  [id number]  MEDICAL RECORD NO.:  [id number]  LOCATION:  9155                          FACILITY:  [facility]  PHYSICIAN:  Malachi Pro. Ambrose Mantle, M.D. DATE OF BIRTH:  07-06-1985  DATE OF ADMISSION:  03/30/2012 DATE OF DISCHARGE:  03/31/2012                              DISCHARGE SUMMARY   HISTORY:  This is a 26 year old black female, para 2-0-2-2, gravida 5 with [facility] September 07, 2012, by an ultrasound at 8 weeks and 6 days done on February 02, 2012.  The patient was admitted to the hospital after a rescue cerclage by Dr. Ellyn Hack because of cervical length only a few millimeters.  The patient's past medical history was positive for Chlamydia and gonorrhea.  She had no known allergies.  She had had a cervical cerclage in 2010 in [place], laparoscopic cholecystectomy in 2008, wisdom teeth extracted at age 16.  FAMILY HISTORY:  Maternal great grandmother had breast cancer.  Mother has diabetes.  Mother has heart disease and kidney failure.  OBSTETRIC HISTORY:  In 2007, D and C for missed abortion at 8 weeks.  In July of 2008, she went to 38 weeks and delivered a 7 pounds and 8 ounces female infant.  She apparently had preterm contractions at 24 weeks, was 1 cm dilated, was treated with intravenous magnesium sulfate, oral terbutaline, hospitalized for 1 month, left AMA, bed rest at home with p.o. terbutaline.  In February of 2011, she had a cerclage placed at 12 weeks, received 17-hydroxyprogesterone and oral meds at home, delivered at 39 weeks.  In November of 2012, she had a spontaneous abortion at 5 weeks.  The patient reported that she was active, but no formal exercise.  She denied illicit substance abuse.  Denied alcohol except on occasion.  Was a former smoker, now smoke 4 cigarettes a day.  On the day of admission, the patient was found to have almost no cervix at the time of her ultrasound and she was admitted for a cerclage.  PHYSICAL  EXAMINATION:  On admission revealed normal vital signs.  Heart and lungs were normal.  Abdomen was soft.  Fundal height appropriate for the gestational age of 16-17 weeks.  Blood group and type O positive. Negative antibody and rubella immune.  RPR nonreactive.  Hepatitis B surface antigen negative.  HIV negative.  Group B strep not recorded. GC and Chlamydia negative.  First trimester screen was normal.  An AFP was normal.  The patient was admitted to the hospital, she was placed on azithromycin and Ancef, placed on ibuprofen.  She underwent the cerclage by Dr. Ellyn Hack.  The next morning, she was afebrile, not having any major problems.  So, I discharged the patient to take Motrin at home per Dr. Ellyn Hack.  FINAL DIAGNOSIS:  Intrauterine pregnancy at 16-17 weeks, incompetent cervix.  OPERATION:  Cervical cerclage.  FINAL CONDITION:  Improved.  The patient was advised to use bed rest, take Motrin 600 mg every 6 hours and call with any problems.     Malachi Pro. Ambrose Mantle, M.D.     TFH/MEDQ  D:  04/18/2012  T:  04/19/2012  Job:  [id number]

## 2012-10-29 IMAGING — US US OB TRANSVAGINAL
1 series · 14 of 16 positions shown · non-contrast
Comparison: none

***ADDENDUM*** CREATED: 04/01/2012 [DATE]

Dictation error in the additional report.
Cervix is open/completely effaced.
Cervical width is 1.2 cm.
***END ADDENDUM*** SIGNED BY: Luucas Vyctor, M.D.
CLINICAL DATA: Cerclage placed 03/30/2012, cervical length
LIMITED OBSTETRIC ULTRASOUND
Number of Fetuses: 1
Heart Rate: 144 bpm
Movement: Present
Presentation: Cephalic
Previa: Absent
Amniotic Fluid (Subjective): Normal
MATERNAL FINDINGS:
Cervix: External os open.  Measures 1.2 cm in length.

[Series 1: us ob transvaginal · 16 acquisitions, 14 frames shown]
[im 1/16]
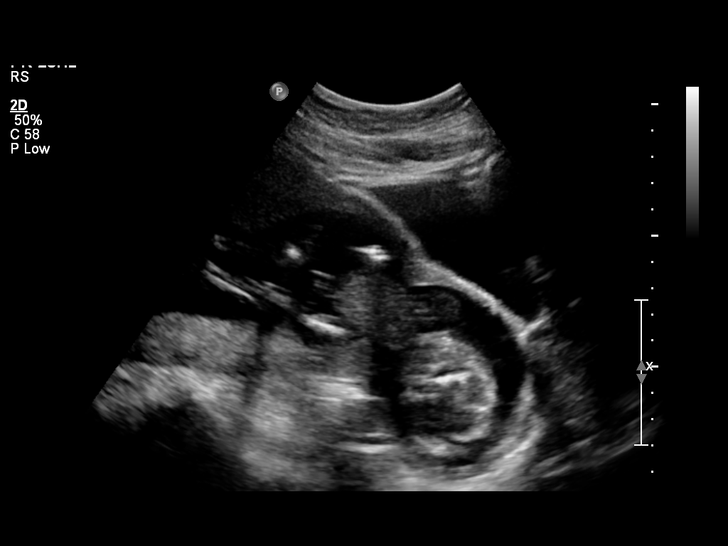
[im 2/16]
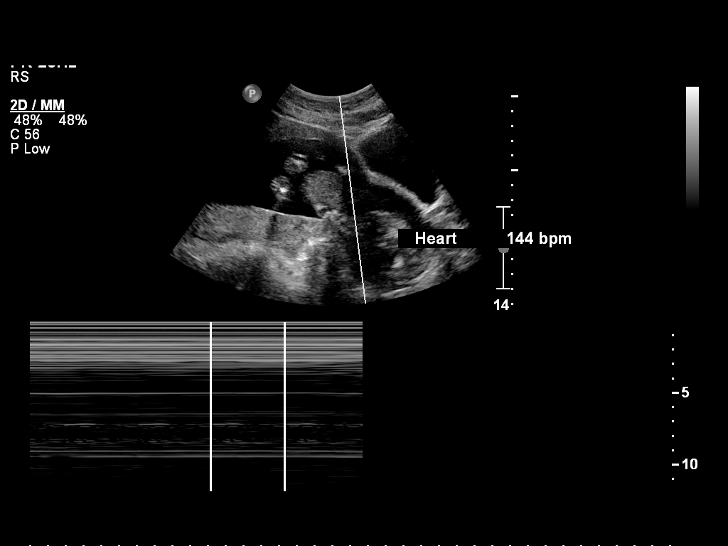
[im 3/16]
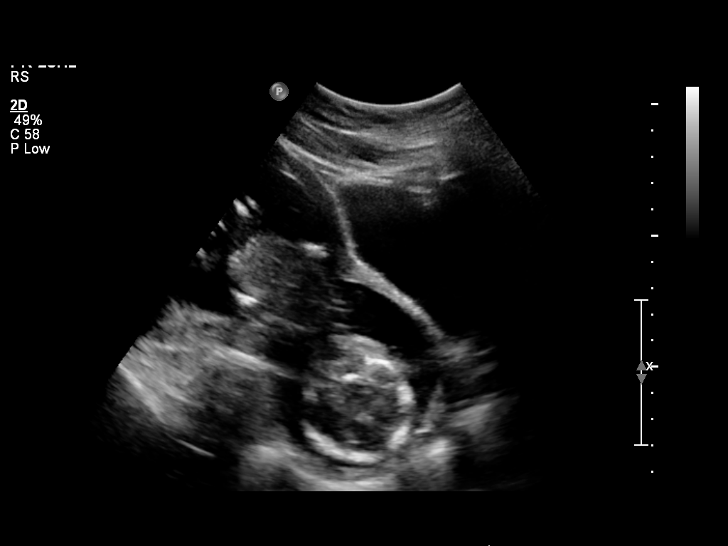
[im 5/16]
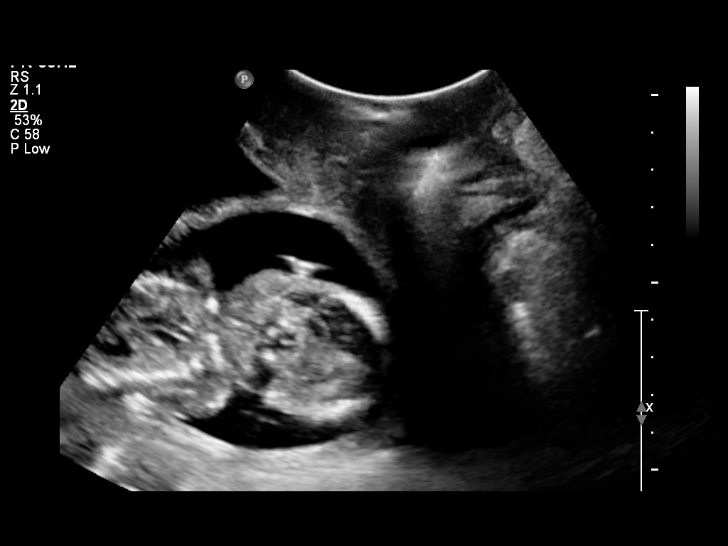
[im 6/16]
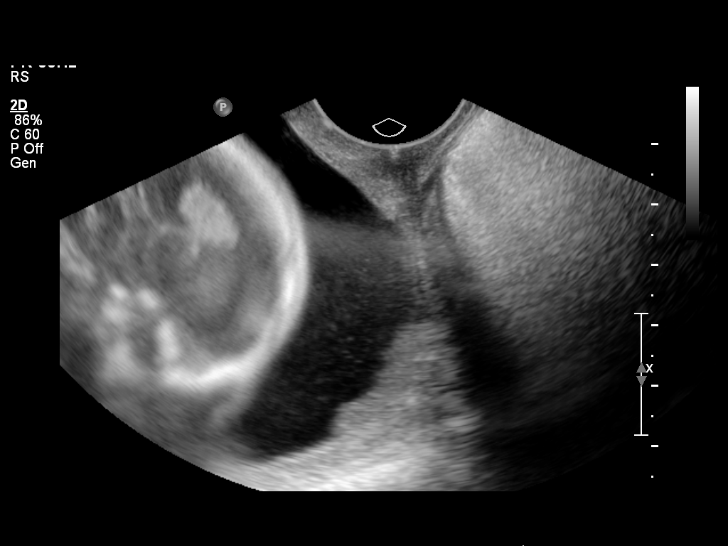
[im 7/16]
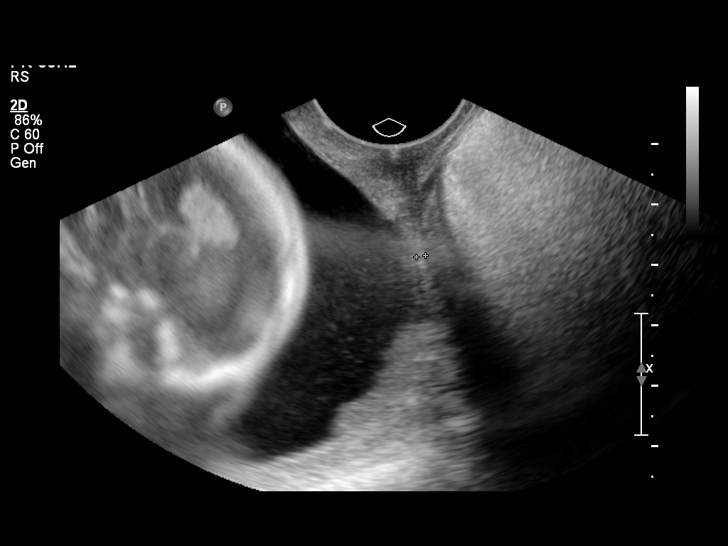
[im 8/16]
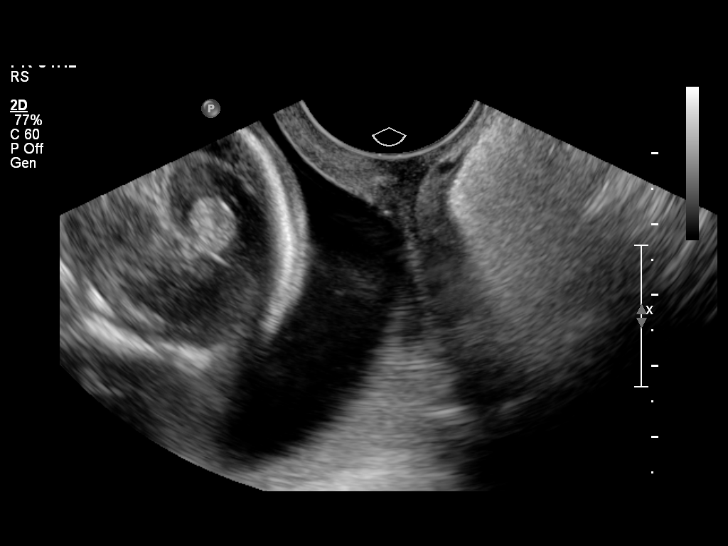
[im 9/16]
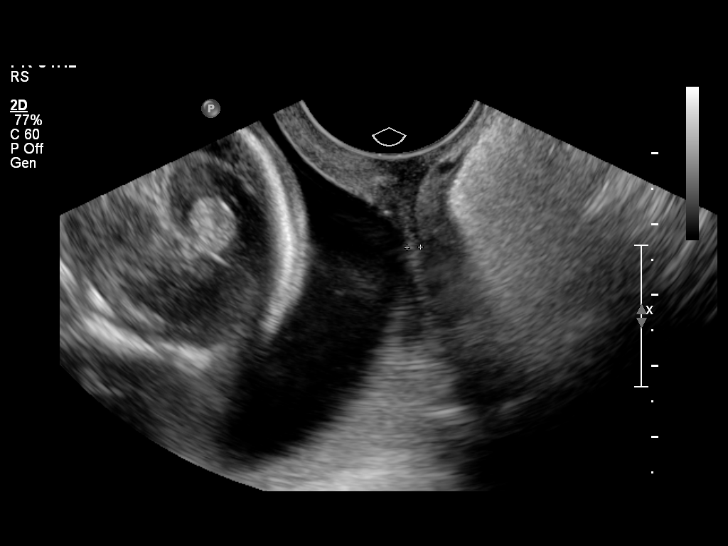
[im 10/16]
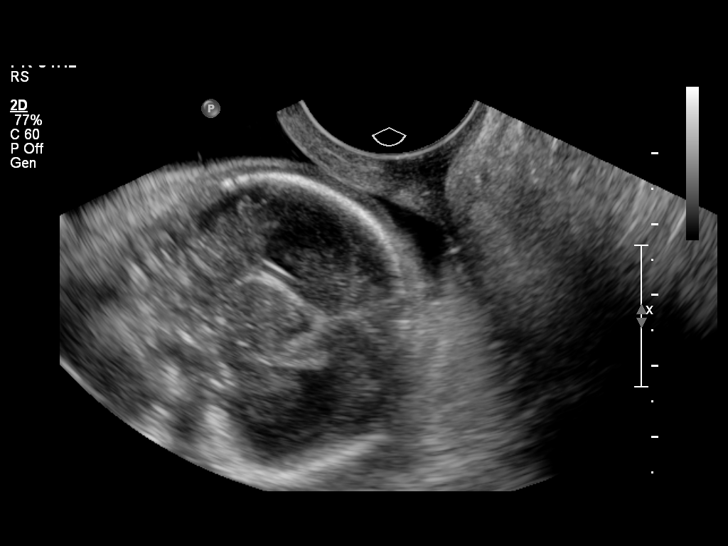
[im 11/16]
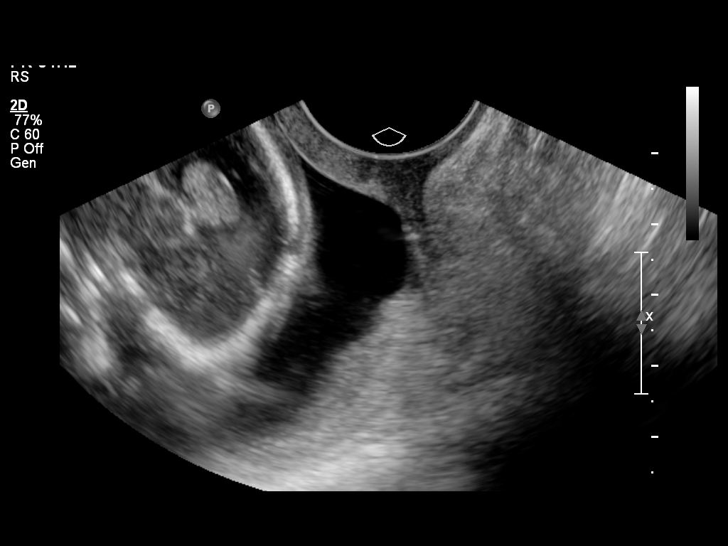
[im 13/16]
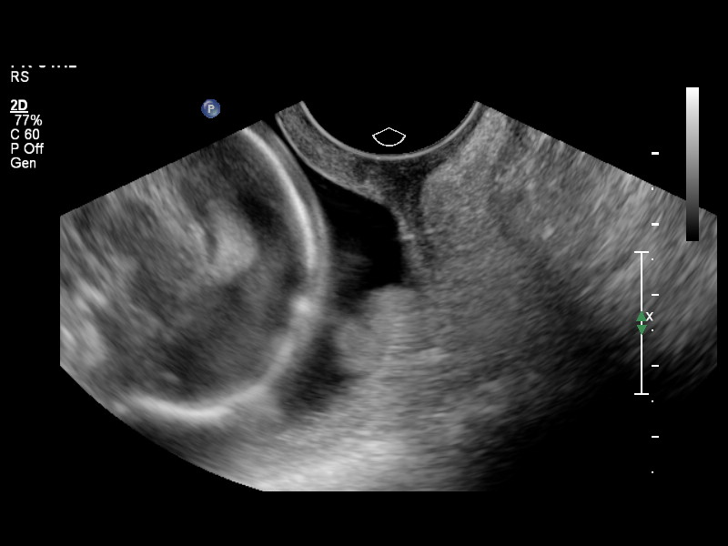
[im 14/16]
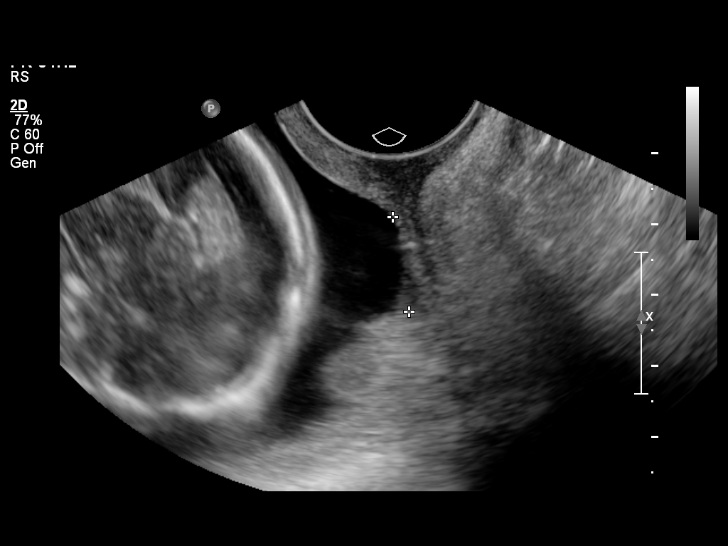
[im 15/16]
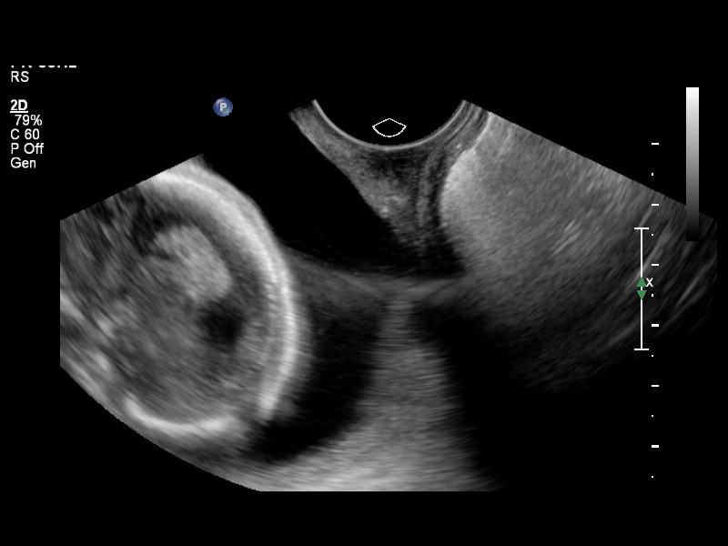
[im 16/16]
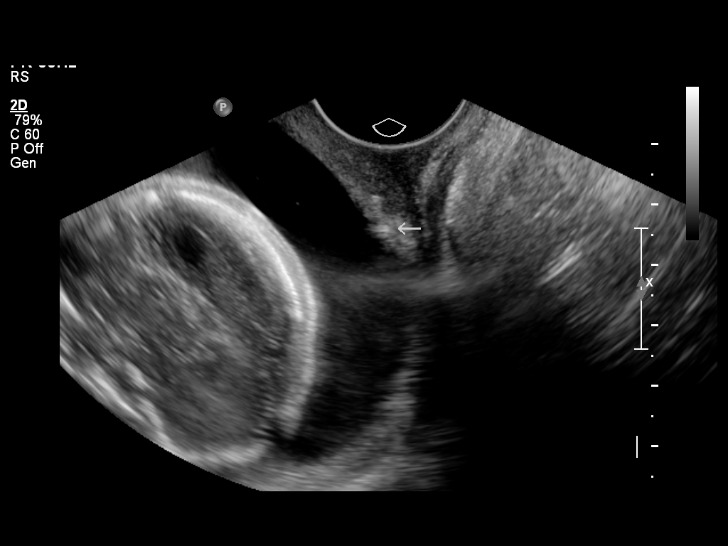

[14 of 16 positions shown; findings below may reference images not displayed]

IMPRESSION: Single live intrauterine gestation, as described above.

Cervical length 1.2 cm.

## 2012-10-30 IMAGING — US US OB TRANSVAGINAL
1 series · 12 of 12 positions shown · non-contrast
Comparison: none

[Series 1: us ob transvaginal · 0.12mm/px · 12 of 12 slices shown]
[im 1/12]
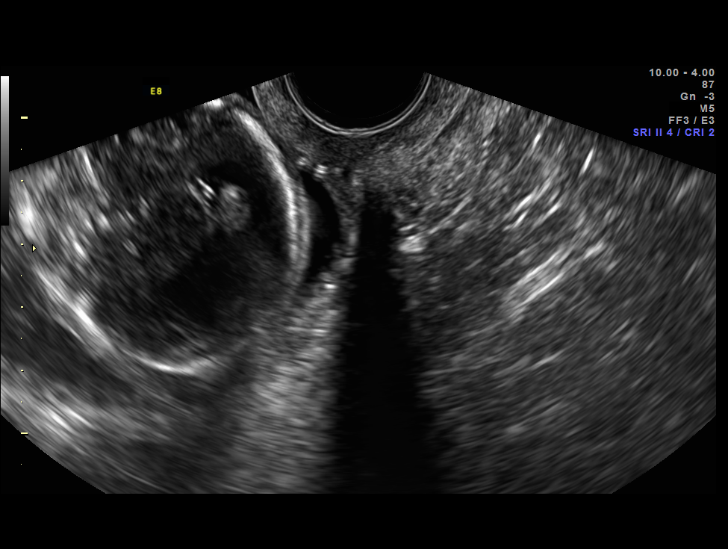
[im 2/12]
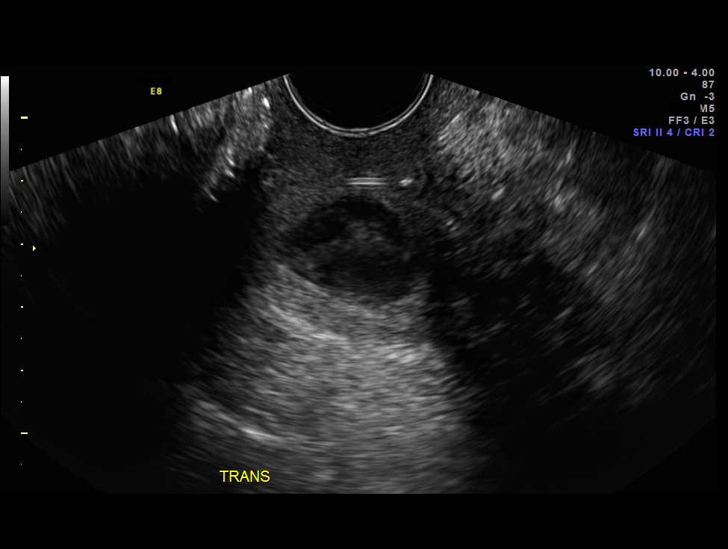
[im 3/12]
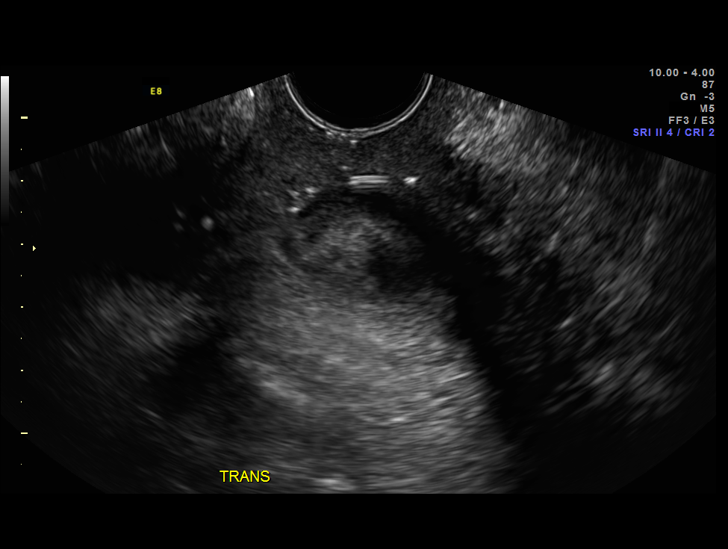
[im 4/12]
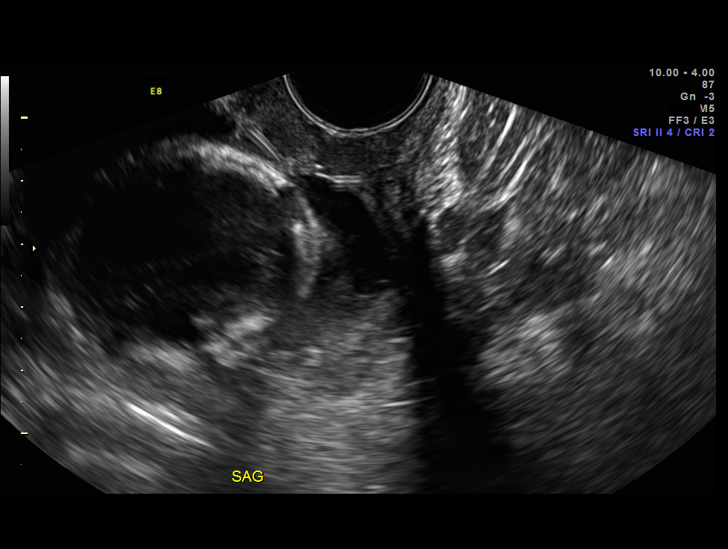
[im 5/12]
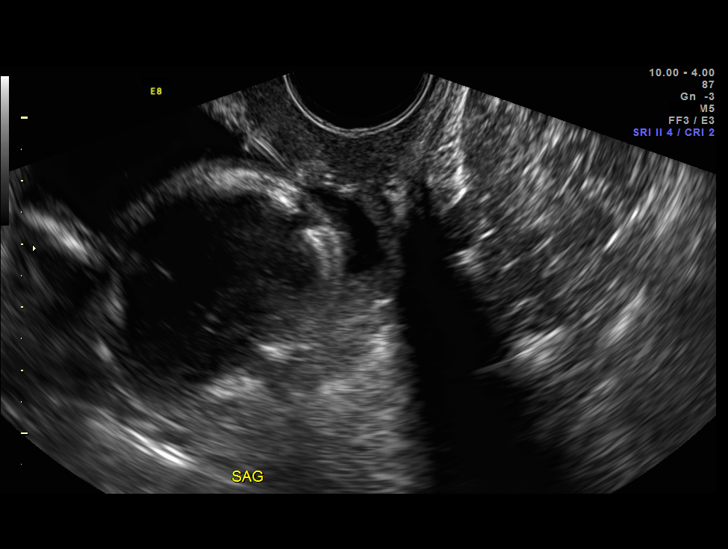
[im 6/12]
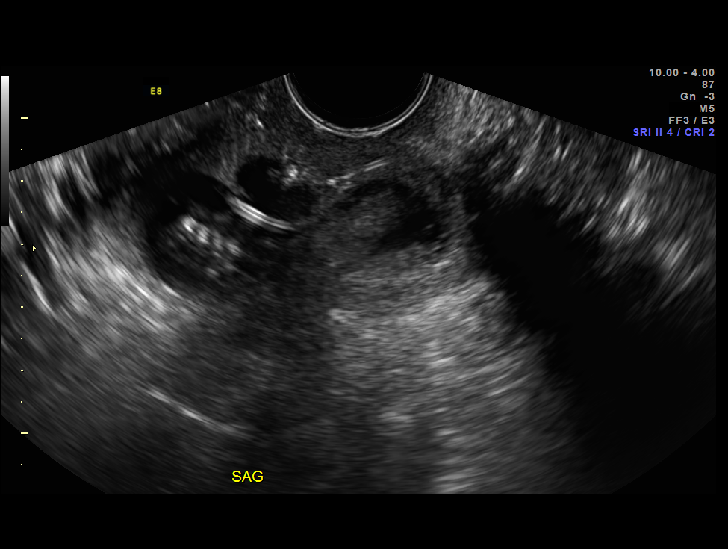
[im 7/12]
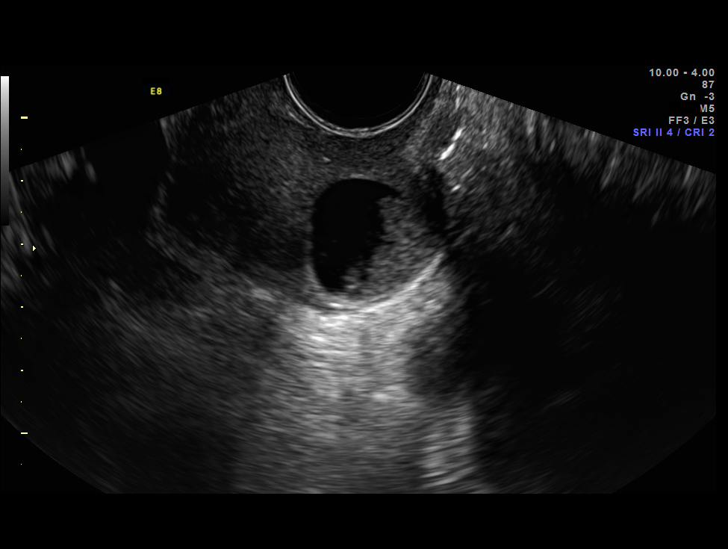
[im 8/12]
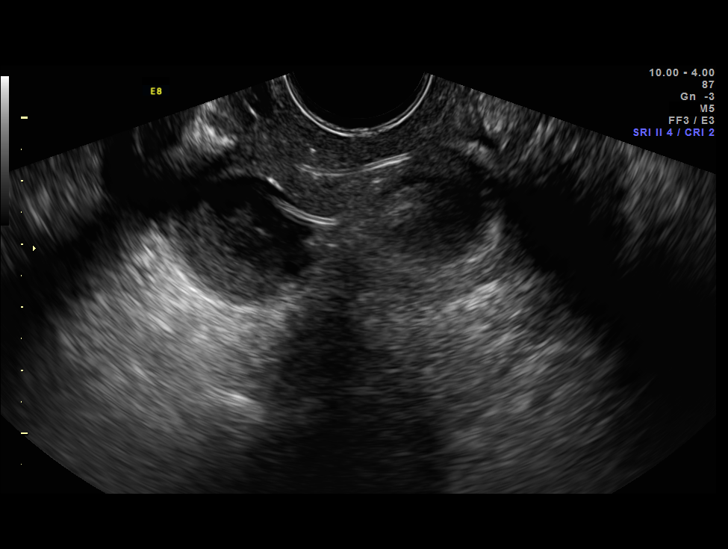
[im 9/12]
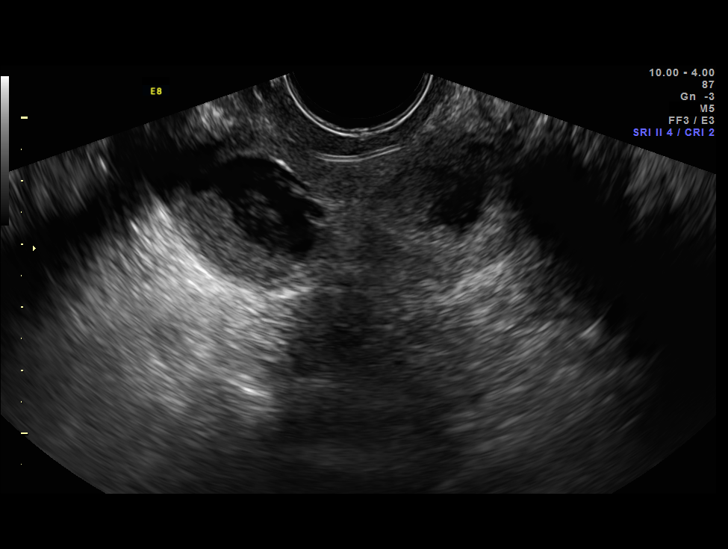
[im 10/12]
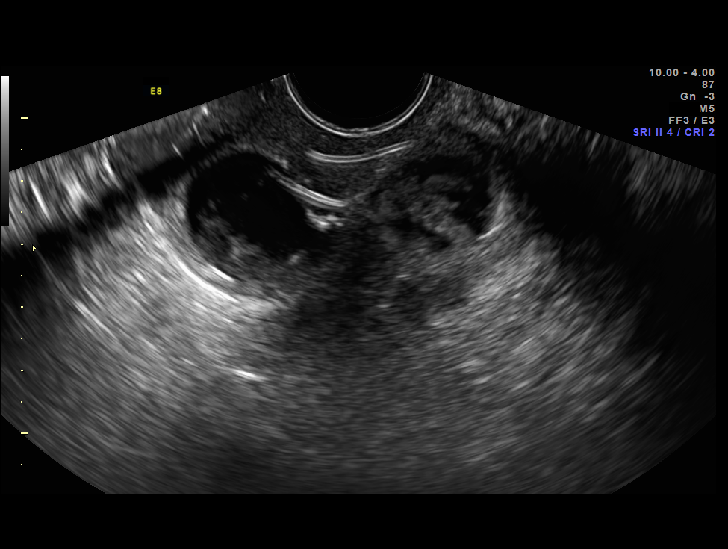
[im 11/12]
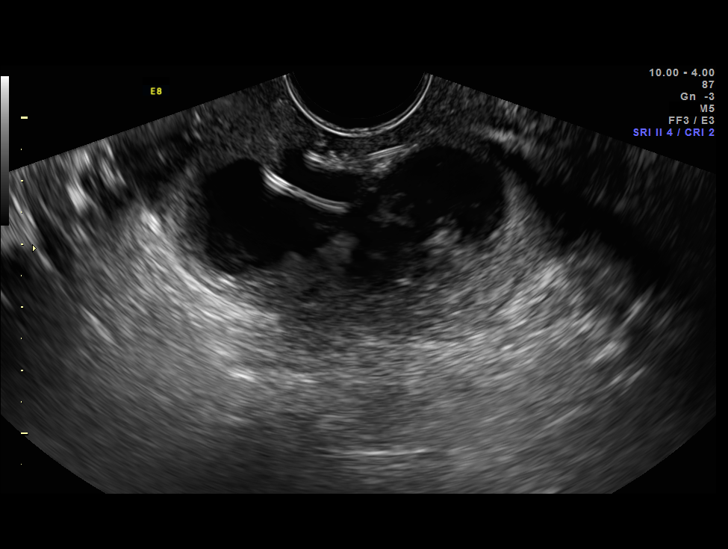
[im 12/12]
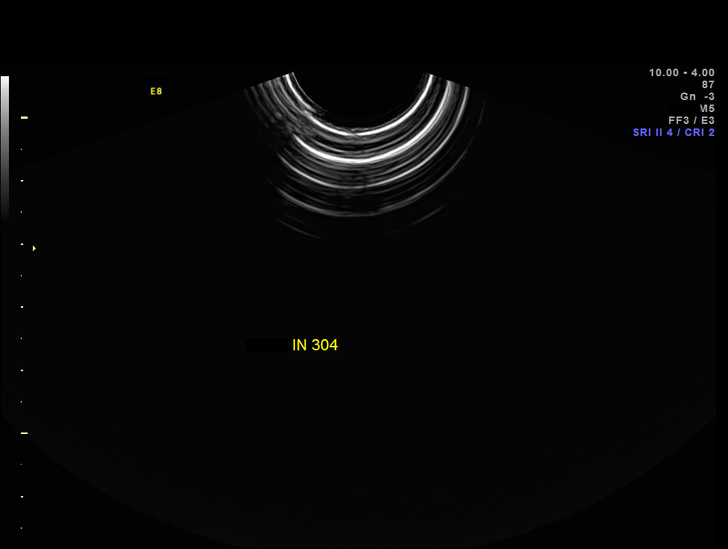

[12 of 12 positions shown; findings below may reference images not displayed]

OBSTETRICS REPORT
                      (Signed Final 04/02/2012 [DATE])

Service(s) Provided

 US OB TRANSVAGINAL                                    76817.0
Indications

 Poor obstetric history (prior pre-term labor)
 Sickle cell trait
 Assess cervical length
 Cervical insufficiency
 Cerclage
Fetal Evaluation

 Num Of Fetuses:    1
 Cardiac Activity:  Observed
 Presentation:      Cephalic
Gestational Age

 Best:          16w 6d     Det. By:  Early Ultrasound         EDD:   09/11/12
                                     (01/15/12)
Cervix Uterus Adnexa

 Cervical Length:    0        cm

 Cervix:       Cerclage visualized.
Comments

 Ms. Mieciu underwent rescue cerclage on [DATE].  She
 was re admitted yesterday due to abdominal pain, vaginal
 bleedings and contractions.  Her abdominal pain has
 completely resolved and she currently denies uterine
 cramping.
 She has been afebrile throughout her hospital course.  Her
 WBC count this AM was 11.2 and she has no signs or
 symptoms of intraamniotic infection.  Ultrasound today shows
 U-shaped funneling with the cervix open to approximately 2
 cm at the level of the cerclage stitch.

 We had a brief discussion regarding prognosis for the fetus.
 Given her current gestational age and current exam, the
 likelihood of achieving viability is low.  With exposed fetal
 membranes, she is at high risk for developing intra-amniotic
 infection or PROM.  We discussed amniocentesis - at this
 current time, I do not feel compelled to perform
 amniocentesis, but would have a low threshold should she
 develop an elevated WBC count or any other s/sx of
 chorioamnionitis.
Impression

 Single IUP at 16 [DATE] weeks
 s/p rescue cerclage on [DATE], readdmitted due to abdominal
 pain/ contractions

 TVUS - cephalic presentation, U-shaped funneling with
 membranes at the external os.  The cervix is open to
 approximately 2 cm
Recommendations

 1) Continue inpatient observation
 2) Daily CBC while admitted - follow WBC count; would have
 low threshold for amniocentesis if leukocytosis or other s/sx
 of intraamniotic infection noted
 3) Would remove cerclage if patient develops PROM or
 active labor
 3) IV Ampicllin x 48 hours; Azithromycin (Z-pack)  - would
 transition of po Amoxicillin after initial 48 hours for full 7 day
 course of antibiotics
 4) Vaginal progesterone supplementation
 5) Once IV antibiotics complete - may discharge home with
 close outpatient follow up
 6) Would readmit at 23 weeks for Erxleben, inpatient
 management if this gestation is achieved

## 2012-11-09 ENCOUNTER — Encounter (HOSPITAL_COMMUNITY): Payer: Self-pay | Admitting: Emergency Medicine

## 2012-11-09 ENCOUNTER — Emergency Department (HOSPITAL_COMMUNITY)
Admission: EM | Admit: 2012-11-09 | Discharge: 2012-11-09 | Disposition: A | Discharge status: Home / Self Care | Payer: Medicaid Other | Attending: Emergency Medicine | Admitting: Emergency Medicine

## 2012-11-09 DIAGNOSIS — Z8742 Personal history of other diseases of the female genital tract: Secondary | ICD-10-CM | POA: Insufficient documentation

## 2012-11-09 DIAGNOSIS — L723 Sebaceous cyst: Secondary | ICD-10-CM | POA: Insufficient documentation

## 2012-11-09 DIAGNOSIS — Z87891 Personal history of nicotine dependence: Secondary | ICD-10-CM | POA: Insufficient documentation

## 2012-11-09 MED ORDER — SULFAMETHOXAZOLE-TRIMETHOPRIM 800-160 MG PO TABS: 1.0000 | ORAL_TABLET | Freq: Two times a day (BID) | ORAL | Status: DC

## 2012-11-09 MED ORDER — TRAMADOL HCL 50 MG PO TABS: 50.0000 mg | ORAL_TABLET | Freq: Four times a day (QID) | ORAL | Status: DC | PRN

## 2012-11-09 NOTE — ED Provider Notes (Signed)
History     CSN: [id number]  Arrival date & time 11/09/12  1024   First MD Initiated Contact with Patient 11/09/12 1138      Chief Complaint  Patient presents with  . Abscess    Under left arm    (Consider location/radiation/quality/duration/timing/severity/associated sxs/prior treatment) HPI Comments: Patient presents to the ED on abscess of her left axilla after shaving under her arms a few days ago.  Abscess present for the past 2 days, growing increasingly tender to palpation.  Pain exacerbated with movement of left arm. Denies any drainage from the area.  Patient had prior abscess of left thigh requiring I&D.  No recent fevers sweats or chills. Has taken ibuprofen for the pain with minimal relief.  Patient is a 27 y.o. female presenting with abscess. The history is provided by the patient.  Abscess   Past Medical History  Diagnosis Date  . 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    Past Surgical History  Procedure Laterality Date  . Cholecystectomy  2008  . Cervical cerclage    . Cervical cerclage  03/30/2012    Procedure: CERCLAGE CERVICAL;  Surgeon: Sherron Monday, MD;  Location: [facility];  Service: Gynecology;  Laterality: N/A;  . Dilation and evacuation  04/07/2012    Procedure: DILATATION AND EVACUATION;  Surgeon: Oliver Pila, MD;  Location: [facility];  Service: Gynecology;  Laterality: N/A;    No family history on file.  History  Substance Use Topics  . Smoking status: Former Smoker    Types: Cigarettes  . Smokeless tobacco: Never Used  . Alcohol Use: No    OB History   Grav Para Term Preterm Abortions TAB SAB Ect Mult Living   3 3 2       3       Review of Systems  Skin:       Abscess  All other systems reviewed and are negative.    Allergies  Review of patient's allergies indicates no known allergies.  Home Medications   Current Outpatient Rx  Name  Route  Sig  Dispense  Refill  . ibuprofen (ADVIL,MOTRIN)  600 MG tablet   Oral   Take 1 tablet (600 mg total) by mouth every 6 (six) hours.   30 tablet   0   . oxyCODONE-acetaminophen (PERCOCET/ROXICET) 5-325 MG per tablet   Oral   Take 1-2 tablets by mouth every 3 (three) hours as needed (moderate - severe pain).   30 tablet   0     BP 119/56  Pulse 86  Temp(Src) 98.4 F (36.9 C) (Oral)  Resp 20  SpO2 99%  LMP 10/29/2012  Breastfeeding? No  Physical Exam  Nursing note and vitals reviewed. Constitutional: She is oriented to person, place, and time. She appears well-developed and well-nourished.  HENT:  Head: Normocephalic and atraumatic.  Eyes: Conjunctivae and EOM are normal. Pupils are equal, round, and reactive to light.  Neck: Normal range of motion. Neck supple.  Cardiovascular: Normal rate, regular rhythm and normal heart sounds.   Pulmonary/Chest: Effort normal and breath sounds normal. No respiratory distress.    Musculoskeletal: Normal range of motion.  Neurological: She is alert and oriented to person, place, and time.  Skin: Skin is warm and dry.  Psychiatric: She has a normal mood and affect.    ED Course  Procedures (including critical care time)  Labs Reviewed - No data to display No results found.   1. Sebaceous  cyst of left axilla       MDM   27 year old F presenting to the ED for left axilla abscess after shaving a few days ago.  No recent fevers, sweats, or chills.  Left axilla with small sebaceous cyst which appeared after shaving.  Patient does not want I&D at this time.  Will try a trial of antibiotics and if no resolution she will return for I&D. Rx Bactrim and tramadol.  Discussed plan with patient, she agreed. Return precautions advised.       Garlon Hatchet, PA-C 11/09/12 1922  Garlon Hatchet, PA-C 11/09/12 [phone]

## 2012-11-09 NOTE — ED Notes (Signed)
Pt c/o abscess under left arm x 2 days. Pt took ibuprofen today at 0730.

## 2012-11-11 NOTE — ED Provider Notes (Signed)
History/physical exam/procedure(s) were performed by non-physician practitioner and as supervising physician I was immediately available for consultation/collaboration. I have reviewed all notes and am in agreement with care and plan.   Hilario Quarry, MD 11/11/12 [phone]

## 2013-05-07 ENCOUNTER — Inpatient Hospital Stay (HOSPITAL_COMMUNITY): Payer: Self-pay

## 2013-05-07 ENCOUNTER — Encounter (HOSPITAL_COMMUNITY): Payer: Self-pay | Admitting: *Deleted

## 2013-05-07 ENCOUNTER — Inpatient Hospital Stay (HOSPITAL_COMMUNITY)
Admission: AD | Admit: 2013-05-07 | Discharge: 2013-05-08 | Disposition: A | Discharge status: Home / Self Care | Payer: Self-pay | Source: Ambulatory Visit | Attending: Obstetrics & Gynecology | Admitting: Obstetrics & Gynecology

## 2013-05-07 DIAGNOSIS — Z3490 Encounter for supervision of normal pregnancy, unspecified, unspecified trimester: Secondary | ICD-10-CM

## 2013-05-07 DIAGNOSIS — B349 Viral infection, unspecified: Secondary | ICD-10-CM

## 2013-05-07 DIAGNOSIS — R509 Fever, unspecified: Secondary | ICD-10-CM | POA: Insufficient documentation

## 2013-05-07 DIAGNOSIS — N369 Urethral disorder, unspecified: Secondary | ICD-10-CM | POA: Diagnosis present

## 2013-05-07 DIAGNOSIS — R51 Headache: Secondary | ICD-10-CM | POA: Insufficient documentation

## 2013-05-07 DIAGNOSIS — IMO0002 Reserved for concepts with insufficient information to code with codable children: Secondary | ICD-10-CM | POA: Insufficient documentation

## 2013-05-07 DIAGNOSIS — O26899 Other specified pregnancy related conditions, unspecified trimester: Secondary | ICD-10-CM

## 2013-05-07 DIAGNOSIS — R109 Unspecified abdominal pain: Secondary | ICD-10-CM | POA: Insufficient documentation

## 2013-05-07 DIAGNOSIS — R300 Dysuria: Secondary | ICD-10-CM | POA: Insufficient documentation

## 2013-05-07 DIAGNOSIS — O99891 Other specified diseases and conditions complicating pregnancy: Secondary | ICD-10-CM | POA: Insufficient documentation

## 2013-05-07 LAB — CBC WITH DIFFERENTIAL/PLATELET
Eosinophils Absolute: 0 10*3/uL (ref 0.0–0.7)
Hemoglobin: 11.2 g/dL — ABNORMAL LOW (ref 12.0–15.0)
Lymphocytes Relative: 26 % (ref 12–46)
Lymphs Abs: 1.6 10*3/uL (ref 0.7–4.0)
MCH: 26.2 pg (ref 26.0–34.0)
Neutro Abs: 3.8 10*3/uL (ref 1.7–7.7)
Neutrophils Relative %: 62 % (ref 43–77)
Platelets: 203 10*3/uL (ref 150–400)
RBC: 4.27 MIL/uL (ref 3.87–5.11)
WBC: 6.1 10*3/uL (ref 4.0–10.5)

## 2013-05-07 LAB — URINALYSIS, ROUTINE W REFLEX MICROSCOPIC
Glucose, UA: NEGATIVE mg/dL
Hgb urine dipstick: NEGATIVE
Specific Gravity, Urine: 1.015 (ref 1.005–1.030)
pH: 6 (ref 5.0–8.0)

## 2013-05-07 LAB — URINE MICROSCOPIC-ADD ON

## 2013-05-07 LAB — WET PREP, GENITAL
Clue Cells Wet Prep HPF POC: NONE SEEN
Yeast Wet Prep HPF POC: NONE SEEN

## 2013-05-07 LAB — POCT PREGNANCY, URINE: Preg Test, Ur: POSITIVE — AB

## 2013-05-07 MED ORDER — LIDOCAINE HCL 2 % EX GEL
Freq: Once | CUTANEOUS | Status: AC
  Administered 2013-05-07: 5 via URETHRAL
  Filled 2013-05-07: qty 5

## 2013-05-07 NOTE — MAU Note (Signed)
Pt LMP 03/18/2013, + home UPT.  Pt having pain and burning with urination and headache x 2 days.

## 2013-05-07 NOTE — MAU Provider Note (Signed)
Chief Complaint: Dysuria and Headache  First Provider Initiated Contact with Patient 05/07/13 2300     SUBJECTIVE HPI: [name] is a 27 y.o. G3P2003 at 7 weeks 1 day by LMP who presents with low abdominal pain, dysuria and generalized headache x2 days, positive home pregnancy test. Describes abdominal pain is suprapubic, constant, 8/10 on pain scale. Describes headache as generalized, throbbing, 8/10 on pain scale. Minimal relief of pain with ibuprofen.   Past Medical History  Diagnosis Date  . 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   OB History  Gravida Para Term Preterm AB SAB TAB Ectopic Multiple Living  4 2 2  1 1    2     # Outcome Date GA Lbr Len/2nd Weight Sex Delivery Anes PTL Lv  4 CUR           3 SAB 04/06/12 [redacted]w[redacted]d     None  ND     Comments: 17.3 weeks incompetent cervix  2 TRM 08/11/09 [redacted]w[redacted]d  3.317 kg (7 lb 5 oz) F SVD EPI  Y     Comments: cerclage at 14 wks  1 TRM 12/21/06 [redacted]w[redacted]d  3.402 kg (7 lb 8 oz) M SVD EPI Y Y     Past Surgical History  Procedure Laterality Date  . Cholecystectomy  2008  . Cervical cerclage    . Cervical cerclage  03/30/2012    Procedure: CERCLAGE CERVICAL;  Surgeon: Sherron Monday, MD;  Location: [facility];  Service: Gynecology;  Laterality: N/A;  . Dilation and evacuation  04/07/2012    Procedure: DILATATION AND EVACUATION;  Surgeon: Oliver Pila, MD;  Location: [facility];  Service: Gynecology;  Laterality: N/A;   History   Social History  . Marital Status: Single    Spouse Name: N/A    Number of Children: N/A  . Years of Education: N/A   Occupational History  . Not on file.   Social History Main Topics  . Smoking status: Former Smoker    Types: Cigarettes  . Smokeless tobacco: Never Used  . Alcohol Use: No  . Drug Use: No  . Sexual Activity: No   Other Topics Concern  . Not on file   Social History Narrative  . No narrative on file   No current facility-administered medications on  file prior to encounter.   No current outpatient prescriptions on file prior to encounter.   No Known Allergies  ROS: Positive for low abdominal pain, dysuria, headache,? Chills. Did not take her temperature. Negative for respiratory complaints (including cough), pain with intercourse, flank pain, nausea, vomiting, diarrhea, constipation, sick contacts, vaginal bleeding, vaginal discharge, difficulties with speech or gait or neck stiffness.  OBJECTIVE Blood pressure 125/63, pulse 90, temperature 100 F (37.8 C), temperature source Oral, resp. rate 16, height 5\' 7"  (1.702 m), weight 98.431 kg (217 lb), last menstrual period 03/18/2013, not currently breastfeeding. Patient Vitals for the past 24 hrs:  BP Temp Temp src Pulse Resp Height Weight  05/07/13 2352 - 100 F (37.8 C) Oral - - - -  05/07/13 2231 125/63 mmHg 100.8 F (38.2 C) Oral 90 16 5\' 7"  (1.702 m) 98.431 kg (217 lb)    GENERAL: Well-developed, well-nourished female in no acute distress.  HEENT: Normocephalic. Throat clear. HEART: normal rate RESP: normal effort. Clear to auscultation bilaterally. ABDOMEN: Soft, non-tender.  BACK: No CVA tenderness. NEURO: Alert and oriented. Normal speech and gait. SPECULUM EXAM: NEFG except for approximately 4-5 3 mm extremely  tender open lesions with erythematous borders surrounding her urethral meatus.  Physiologic discharge, no blood noted initially, but cervix very friable.? Nabothian cyst and? Ectropion visualized. BIMANUAL: cervix closed; unable to assess uterine size due to body habitus, no adnexal tenderness or masses. No definite cervical motion tenderness, but exam limited by patient's severe sensitivity around urethral meatus.  LAB RESULTS Results for orders placed during the hospital encounter of 05/07/13 (from the past 24 hour(s))  URINALYSIS, ROUTINE W REFLEX MICROSCOPIC     Status: Abnormal   Collection Time    05/07/13 10:40 PM      Result Value Range   Color, Urine YELLOW   YELLOW   APPearance CLEAR  CLEAR   Specific Gravity, Urine 1.015  1.005 - 1.030   pH 6.0  5.0 - 8.0   Glucose, UA NEGATIVE  NEGATIVE mg/dL   Hgb urine dipstick NEGATIVE  NEGATIVE   Bilirubin Urine NEGATIVE  NEGATIVE   Ketones, ur NEGATIVE  NEGATIVE mg/dL   Protein, ur NEGATIVE  NEGATIVE mg/dL   Urobilinogen, UA 0.2  0.0 - 1.0 mg/dL   Nitrite NEGATIVE  NEGATIVE   Leukocytes, UA TRACE (*) NEGATIVE  URINE MICROSCOPIC-ADD ON     Status: Abnormal   Collection Time    05/07/13 10:40 PM      Result Value Range   Squamous Epithelial / LPF RARE  RARE   WBC, UA 0-2  <3 WBC/hpf   RBC / HPF 0-2  <3 RBC/hpf   Bacteria, UA FEW (*) RARE  POCT PREGNANCY, URINE     Status: Abnormal   Collection Time    05/07/13 10:41 PM      Result Value Range   Preg Test, Ur POSITIVE (*) NEGATIVE  HCG, QUANTITATIVE, PREGNANCY     Status: Abnormal   Collection Time    05/07/13 10:53 PM      Result Value Range   hCG, Beta Chain, Quant, S 22909 (*) <5 mIU/mL  WET PREP, GENITAL     Status: Abnormal   Collection Time    05/07/13 11:15 PM      Result Value Range   Yeast Wet Prep HPF POC NONE SEEN  NONE SEEN   Trich, Wet Prep NONE SEEN  NONE SEEN   Clue Cells Wet Prep HPF POC NONE SEEN  NONE SEEN   WBC, Wet Prep HPF POC FEW (*) NONE SEEN  CBC WITH DIFFERENTIAL     Status: Abnormal   Collection Time    05/07/13 11:17 PM      Result Value Range   WBC 6.1  4.0 - 10.5 K/uL   RBC 4.27  3.87 - 5.11 MIL/uL   Hemoglobin 11.2 (*) 12.0 - 15.0 g/dL   HCT 16.1 (*) 09.6 - 04.5 %   MCV 77.0 (*) 78.0 - 100.0 fL   MCH 26.2  26.0 - 34.0 pg   MCHC 34.0  30.0 - 36.0 g/dL   RDW 40.9  81.1 - 91.4 %   Platelets 203  150 - 400 K/uL   Neutrophils Relative % 62  43 - 77 %   Neutro Abs 3.8  1.7 - 7.7 K/uL   Lymphocytes Relative 26  12 - 46 %   Lymphs Abs 1.6  0.7 - 4.0 K/uL   Monocytes Relative 11  3 - 12 %   Monocytes Absolute 0.7  0.1 - 1.0 K/uL   Eosinophils Relative 0  0 - 5 %   Eosinophils Absolute 0.0  0.0 - 0.7 K/uL  Basophils Relative 1  0 - 1 %   Basophils Absolute 0.0  0.0 - 0.1 K/uL    IMAGING US Ob Comp Less 14 Wks  05/08/2013   CLINICAL DATA:  Abdominal pain.  EXAM: OBSTETRIC <14 WK [place] AND TRANSVAGINAL OB US  TECHNIQUE: Both transabdominal and transvaginal ultrasound examinations were performed for complete evaluation of the gestation as well as the maternal uterus, adnexal regions, and pelvic cul-de-sac. Transvaginal technique was performed to assess early pregnancy.  COMPARISON:  04/02/2012  FINDINGS: Intrauterine gestational sac: Visualized/normal in shape. There is a very small subchorionic hemorrhage, located along the lower margin of the sac, measuring less than a cm in maximal dimension.  Yolk sac:  Present.  Embryo:  Present.  Cardiac Activity: Present.  Heart Rate:  142 bpm  CRL:   8.8  mm   7 w 0 d                  [place] EDC: 12/25/2013  Maternal uterus/adnexae: Normal size and appearance of the ovaries. Uterus is retro positioned. Incidental nabothian cysts.  IMPRESSION: 1. Single living intrauterine gestation, estimated age 44 weeks 0 day. 2. Trace subchorionic blood.   Electronically Signed   By: Tiburcio Pea M.D.   On: 05/08/2013 01:28   US Ob Transvaginal  05/08/2013   CLINICAL DATA:  Abdominal pain.  EXAM: OBSTETRIC <14 WK [place] AND TRANSVAGINAL OB US  TECHNIQUE: Both transabdominal and transvaginal ultrasound examinations were performed for complete evaluation of the gestation as well as the maternal uterus, adnexal regions, and pelvic cul-de-sac. Transvaginal technique was performed to assess early pregnancy.  COMPARISON:  04/02/2012  FINDINGS: Intrauterine gestational sac: Visualized/normal in shape. There is a very small subchorionic hemorrhage, located along the lower margin of the sac, measuring less than a cm in maximal dimension.  Yolk sac:  Present.  Embryo:  Present.  Cardiac Activity: Present.  Heart Rate:  142 bpm  CRL:   8.8  mm   7 w 0 d                  [place] EDC: 12/25/2013  Maternal  uterus/adnexae: Normal size and appearance of the ovaries. Uterus is retro positioned. Incidental nabothian cysts.  IMPRESSION: 1. Single living intrauterine gestation, estimated age 44 weeks 0 day. 2. Trace subchorionic blood.   Electronically Signed   By: Tiburcio Pea M.D.   On: 05/08/2013 01:28   MAU COURSE CBC with differential, OB ultrasound, GC Chlamydia, wet prep, herpes viral culture, lidocaine jelly, Tylenol and Valtrex.   ASSESSMENT 1. Abdominal pain complicating pregnancy, antepartum   2. Intrauterine pregnancy   3. Urethral lesion   4.  fever from Viral syndrome versus possible primary genital herpes outbreak     PLAN Discharge home in stable condition per consult with Dr. Debroah Loop. We'll start Valtrex due to high suspicion for herpes. Informed patient that herpes culture may be negative very early in primary outbreak or if inadequate specimen obtained. If negative, recommend repeat culture if lesions recur and/or blood testing. No intercourse until lesions heal, then always use condoms. Informed her partner that you're being tested for genital herpes. Encouraged patient to start prenatal care as soon as possible. Will need evaluation of cervical length at approximately 14 weeks secondary to history of incompetent cervix. List of providers given. Pregnancy verification letter given.     Follow-up Information   Please follow up. ([facility] prenatal care)       Follow up with THE  [facility] [place] [facility]. (As needed if symptoms worsen)    Contact information:   [address] [id number] [place] [address] [phone]       Medication List    STOP taking these medications       ibuprofen 600 MG tablet  Commonly known as:  ADVIL,MOTRIN     oxyCODONE-acetaminophen 5-325 MG per tablet  Commonly known as:  PERCOCET/ROXICET     sulfamethoxazole-trimethoprim 800-160 MG per tablet  Commonly known as:  SEPTRA DS      TAKE these  medications       docusate sodium 100 MG capsule  Commonly known as:  COLACE  Take 100 mg by mouth 2 (two) times daily.     traMADol 50 MG tablet  Commonly known as:  ULTRAM  Take 1-2 tablets (50-100 mg total) by mouth every 6 (six) hours as needed.     valACYclovir 500 MG tablet  Commonly known as:  VALTREX  Take 2 tablets (1,000 mg total) by mouth daily. X 10 days for first outbreak. X 5 days for subsequent outbreaks.       [place], CNM 05/08/2013  2:09 AM

## 2013-05-08 ENCOUNTER — Inpatient Hospital Stay (HOSPITAL_COMMUNITY): Payer: Self-pay

## 2013-05-08 DIAGNOSIS — R109 Unspecified abdominal pain: Secondary | ICD-10-CM

## 2013-05-08 DIAGNOSIS — O9989 Other specified diseases and conditions complicating pregnancy, childbirth and the puerperium: Secondary | ICD-10-CM

## 2013-05-08 DIAGNOSIS — N369 Urethral disorder, unspecified: Secondary | ICD-10-CM | POA: Diagnosis present

## 2013-05-08 LAB — GC/CHLAMYDIA PROBE AMP
CT Probe RNA: NEGATIVE
GC Probe RNA: NEGATIVE

## 2013-05-08 LAB — HCG, QUANTITATIVE, PREGNANCY: hCG, Beta Chain, Quant, S: 22909 m[IU]/mL — ABNORMAL HIGH (ref <5)

## 2013-05-08 MED ORDER — TRAMADOL HCL 50 MG PO TABS: 50.0000 mg | ORAL_TABLET | Freq: Four times a day (QID) | ORAL | Status: DC | PRN

## 2013-05-08 MED ORDER — ACETAMINOPHEN 325 MG PO TABS
650.0000 mg | ORAL_TABLET | Freq: Once | ORAL | Status: AC
  Administered 2013-05-08: 650 mg via ORAL
  Filled 2013-05-08: qty 2

## 2013-05-08 MED ORDER — VALACYCLOVIR HCL 500 MG PO TABS: 1000.0000 mg | ORAL_TABLET | Freq: Every day | ORAL | Status: AC

## 2013-05-08 MED ORDER — VALACYCLOVIR HCL 500 MG PO TABS
1000.0000 mg | ORAL_TABLET | Freq: Once | ORAL | Status: AC
  Administered 2013-05-08: 1000 mg via ORAL
  Filled 2013-05-08: qty 2

## 2013-05-09 LAB — HERPES SIMPLEX VIRUS CULTURE: Special Requests: NORMAL

## 2013-05-11 ENCOUNTER — Telehealth: Payer: Self-pay | Admitting: Obstetrics and Gynecology

## 2013-06-20 NOTE — L&D Delivery Note (Signed)
Delivery Note  PRE-OPERATIVE DIAGNOSIS:  1) 7956w6d pregnancy.   POST-OPERATIVE DIAGNOSIS:  1) 3956w6d pregnancy s/p Vaginal, Spontaneous Delivery ; IUFD  Delivery Type: Vaginal, Spontaneous Delivery   Delivery Clinician: Fredirick LatheACOSTA, KRISTY ROCIO   Delivery Anesthesia: None   Labor Complications:   IUFD, post partum hemorrhage  Lacerations: None   ESTIMATED BLOOD LOSS: 900    Labor course: This is a 28 y.o. y.o. female [phone]  who came in at 5256w6d pregnancy complaining of pressure in her vagina.  Her prenatal course was complicated by h/o cervical incompetence with cerclage for last 3 pregnancies and h/o 3 SABs. Patient was at work this AM, when she started to feel vaginal pressure around 0830. She went to the restroom and felt fluid leakage and vaginal bleeding. She has not been able to establish for prenatal care yet, as her Medicaid is pending. In the MAU, ultrasound showed no fetal heart beat, and patient with ROM after US.  She was admitted to L and D.  Labor course included:  Spontaneous delivery of fetus   Procedure: Vaginal, Spontaneous Delivery   Date of birth: 05/05/2012   Time of birth: 12:37 PM    This [id number] woman under IV anesthesia delivered a non-viable fetus.  Delivery was via NSVD.   Delivery completed and cord cut.  Intact placenta delivered spontaneously at 11/16  3:08 PM . No vaginal lacerations noted. Uterus well contracted at end of delivery.  Mother tolerated delivery well and chaplain support provided.   FINDINGS:   1) Female infant, Apgar scores of 0    at 1 minute 0    at 5 minutes    SPECIMENS: Placenta to pathology   COMPLICATIONS: Post Partum hemorrhage  DISPOSITION:  Infant to cremation per maternal request

## 2013-06-20 NOTE — L&D Delivery Note (Signed)
Delivery Note At 7:51 AM a viable female was delivered via Vaginal, Spontaneous Delivery (Presentation: Right Occiput Anterior).  APGAR: 9, 9; weight pending.   Placenta status: Intact, Spontaneous.  Cord:  with the following complications: None.  Anesthesia: None  Episiotomy: None Lacerations: 1st degree Suture Repair: none Est. Blood Loss (mL): 350  Mom to postpartum.  Baby to Couplet care / Skin to Skin.  [facility],TODD D 12/06/2013, 8:17 AM

## 2013-07-29 ENCOUNTER — Encounter (HOSPITAL_COMMUNITY): Payer: Self-pay | Admitting: Pharmacist

## 2013-07-31 ENCOUNTER — Ambulatory Visit (HOSPITAL_COMMUNITY)
Admission: RE | Admit: 2013-07-31 | Payer: Medicaid Other | Source: Ambulatory Visit | Admitting: Obstetrics and Gynecology

## 2013-07-31 ENCOUNTER — Encounter (HOSPITAL_COMMUNITY): Admission: RE | Payer: Self-pay | Source: Ambulatory Visit

## 2013-07-31 SURGERY — CERCLAGE, CERVIX, VAGINAL APPROACH
Anesthesia: Choice

## 2013-08-15 ENCOUNTER — Encounter (HOSPITAL_COMMUNITY): Payer: Self-pay

## 2013-08-16 ENCOUNTER — Encounter (HOSPITAL_COMMUNITY): Payer: Self-pay

## 2013-08-18 NOTE — H&P (Signed)
Natalie BlackbirdVontessa [name] is a 28 y.o. female [id number] at 22+ with h/o history of incompetent cervix and cervical shortening on serial US for cerclage.  +FM, no LOF, no VB no ctx/pressure. Pt was late entry to care at 18 wk.   Pt was set up for prophylactic cerclage earlier in pregnancy apx 19wk, declined.  Now since cervix shortened from 4cm to 2.37-2.53cm  D/W pt at length r/b/a of [place] cerclage.  Pt states nervous re procedure, has had rescue cerclage that failed in 2013. Pt using vaginal progesterone and serial cervical length.  Pt voices understanding, wishes to proceed.   Maternal Medical History:  Fetal activity: Perceived fetal activity is normal.    Prenatal complications: no prenatal complications   OB History   Grav Para Term Preterm Abortions TAB SAB Ect Mult Living   4 2 2  1  1   2     G1 SAB at 8 wk, G2 SVD 7#8 female, G3 SVD 7#15 female, cerclage in [place], G4 5wk SAB, G5 17wk loss after rescue cerclage, G6 present; no abn pap, h/o GC, Chl, trich, HSV  Past Medical History  Diagnosis Date  . 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   Past Surgical History  Procedure Laterality Date  . Cholecystectomy  2008  . Cervical cerclage    . Cervical cerclage  03/30/2012    Procedure: CERCLAGE CERVICAL;  Surgeon: Sherron MondayJody Bovard, MD;  Location: [facility];  Service: Gynecology;  Laterality: N/A;  . Dilation and evacuation  04/07/2012    Procedure: DILATATION AND EVACUATION;  Surgeon: Oliver PilaKathy W Richardson, MD;  Location: [facility];  Service: Gynecology;  Laterality: N/A;   Family History: family history includes Diabetes in her mother; Heart disease in her mother; Hypertension in her mother; Kidney disease in her mother.Breast Cancer Social History:  reports that she quit smoking about 3 years ago. Her smoking use included Cigarettes. She has a .5 pack-year smoking history. She has never used smokeless tobacco. She reports that she does not drink alcohol or use  illicit drugs.single, FOB not involved Meds PNV, vaginal prometrium All NKDA   Prenatal Transfer Tool  Maternal Diabetes: No - too early in preg Genetic Screening: Declined Maternal Ultrasounds/Referrals: Normal Fetal Ultrasounds or other Referrals:  None Maternal Substance Abuse:  No Significant Maternal Medications:  None Significant Maternal Lab Results:  None Other Comments:  h/o cervical incompetence and cerclage  Review of Systems  Constitutional: Negative.   HENT: Negative.   Eyes: Negative.   Respiratory: Negative.   Cardiovascular: Negative.   Gastrointestinal: Negative.   Genitourinary: Negative.   Musculoskeletal: Negative.   Skin: Negative.   Neurological: Negative.   Psychiatric/Behavioral: Negative.       Last menstrual period 03/18/2013. Maternal Exam:  Introitus: Normal vulva. Normal vagina.  Pelvis: adequate for delivery.   Cervix: Cervix evaluated by digital exam.     Physical Exam  Constitutional: She is oriented to person, place, and time. She appears well-developed and well-nourished.  HENT:  Head: Normocephalic and atraumatic.  Cardiovascular: Normal rate and regular rhythm.   Respiratory: Effort normal and breath sounds normal. No respiratory distress. She has no wheezes.  GI: Soft. Bowel sounds are normal. She exhibits no distension. There is no tenderness.  Musculoskeletal: Normal range of motion.  Neurological: She is alert and oriented to person, place, and time.  Skin: Skin is warm and dry.  Psychiatric: She has a normal mood and affect. Her behavior is normal.  Prenatal labs: ABO, Rh:  O+ Antibody:  neg Rubella:  immune RPR:   NR HBsAg:   neg HIV:   neg GBS:   N/A  Hgb 11.9/Plt 246K  Dated by LMP cw 7wkUS [place] - limited nl anat, ant plac,female F/u US completes anat, shortened cervix, cervical funneling  Assessment/Plan: 27yo [id number] at 22+ for cervical cerclage given cervical shortening Ibuprofen ATC [place] cerclage D/w  pt r/b/a of cerclage.   BOVARD,Dawnette Mione 08/18/2013, 8:29 PM

## 2013-08-19 ENCOUNTER — Encounter (HOSPITAL_COMMUNITY): Payer: Medicaid Other | Admitting: Anesthesiology

## 2013-08-19 ENCOUNTER — Ambulatory Visit (HOSPITAL_COMMUNITY)
Admission: RE | Admit: 2013-08-19 | Discharge: 2013-08-19 | Disposition: A | Discharge status: Home / Self Care | Payer: Medicaid Other | Source: Ambulatory Visit | Attending: Obstetrics and Gynecology | Admitting: Obstetrics and Gynecology

## 2013-08-19 ENCOUNTER — Encounter (HOSPITAL_COMMUNITY): Payer: Self-pay | Admitting: Certified Registered"

## 2013-08-19 ENCOUNTER — Encounter (HOSPITAL_COMMUNITY)
Admission: RE | Disposition: A | Discharge status: Home / Self Care | Payer: Self-pay | Source: Ambulatory Visit | Attending: Obstetrics and Gynecology

## 2013-08-19 ENCOUNTER — Ambulatory Visit (HOSPITAL_COMMUNITY): Payer: Medicaid Other | Admitting: Anesthesiology

## 2013-08-19 DIAGNOSIS — O3430 Maternal care for cervical incompetence, unspecified trimester: Secondary | ICD-10-CM | POA: Insufficient documentation

## 2013-08-19 DIAGNOSIS — Z87891 Personal history of nicotine dependence: Secondary | ICD-10-CM | POA: Insufficient documentation

## 2013-08-19 DIAGNOSIS — O26879 Cervical shortening, unspecified trimester: Secondary | ICD-10-CM | POA: Insufficient documentation

## 2013-08-19 HISTORY — PX: CERVICAL CERCLAGE: SHX1329

## 2013-08-19 LAB — CBC
HEMATOCRIT: 35.2 % — AB (ref 36.0–46.0)
Hemoglobin: 12.1 g/dL (ref 12.0–15.0)
MCH: 27.3 pg (ref 26.0–34.0)
MCHC: 34.4 g/dL (ref 30.0–36.0)
MCV: 79.5 fL (ref 78.0–100.0)
Platelets: 245 10*3/uL (ref 150–400)
RBC: 4.43 MIL/uL (ref 3.87–5.11)
RDW: 13.7 % (ref 11.5–15.5)
WBC: 11.7 10*3/uL — AB (ref 4.0–10.5)

## 2013-08-19 SURGERY — CERCLAGE, CERVIX, VAGINAL APPROACH
Anesthesia: Spinal | Site: Vagina

## 2013-08-19 MED ORDER — ONDANSETRON HCL 4 MG/2ML IJ SOLN
INTRAMUSCULAR | Status: AC
  Filled 2013-08-19: qty 2

## 2013-08-19 MED ORDER — PHENYLEPHRINE HCL 10 MG/ML IJ SOLN
INTRAMUSCULAR | Status: DC | PRN
  Administered 2013-08-19 (×2): 40 ug via INTRAVENOUS
  Administered 2013-08-19: 80 ug via INTRAVENOUS
  Administered 2013-08-19: 40 ug via INTRAVENOUS

## 2013-08-19 MED ORDER — PHENYLEPHRINE 40 MCG/ML (10ML) SYRINGE FOR IV PUSH (FOR BLOOD PRESSURE SUPPORT)
PREFILLED_SYRINGE | INTRAVENOUS | Status: AC
  Filled 2013-08-19: qty 5

## 2013-08-19 MED ORDER — LACTATED RINGERS IV SOLN
INTRAVENOUS | Status: DC
  Administered 2013-08-19: 13:00:00 via INTRAVENOUS

## 2013-08-19 MED ORDER — LIDOCAINE 1%/NA BICARB 0.1 MEQ INJECTION
INJECTION | INTRAVENOUS | Status: AC
  Filled 2013-08-19: qty 2

## 2013-08-19 MED ORDER — PROPOFOL 10 MG/ML IV EMUL
INTRAVENOUS | Status: AC
  Filled 2013-08-19: qty 20

## 2013-08-19 MED ORDER — FENTANYL CITRATE 0.05 MG/ML IJ SOLN: 25.0000 ug | INTRAMUSCULAR | Status: DC | PRN

## 2013-08-19 MED ORDER — FENTANYL CITRATE 0.05 MG/ML IJ SOLN
INTRAMUSCULAR | Status: AC
  Filled 2013-08-19: qty 2

## 2013-08-19 MED ORDER — IBUPROFEN 800 MG PO TABS: 800.0000 mg | ORAL_TABLET | Freq: Three times a day (TID) | ORAL | Status: DC

## 2013-08-19 MED ORDER — CEFAZOLIN SODIUM-DEXTROSE 2-3 GM-% IV SOLR
2.0000 g | INTRAVENOUS | Status: AC
  Administered 2013-08-19: 2 g via INTRAVENOUS

## 2013-08-19 MED ORDER — CEFAZOLIN SODIUM-DEXTROSE 2-3 GM-% IV SOLR
INTRAVENOUS | Status: AC
  Filled 2013-08-19: qty 50

## 2013-08-19 MED ORDER — FENTANYL CITRATE 0.05 MG/ML IJ SOLN
INTRAMUSCULAR | Status: DC | PRN
  Administered 2013-08-19 (×2): 50 ug via INTRAVENOUS

## 2013-08-19 MED ORDER — LACTATED RINGERS IV SOLN
INTRAVENOUS | Status: DC
  Administered 2013-08-19 (×2): via INTRAVENOUS

## 2013-08-19 MED ORDER — LIDOCAINE IN DEXTROSE 5-7.5 % IV SOLN
INTRAVENOUS | Status: AC
  Filled 2013-08-19: qty 2

## 2013-08-19 SURGICAL SUPPLY — 21 items
CANISTER SUCT 3000ML (MISCELLANEOUS) ×2 IMPLANT
CATH ROBINSON RED A/P 16FR (CATHETERS) IMPLANT
CLOTH BEACON ORANGE TIMEOUT ST (SAFETY) ×2 IMPLANT
COUNTER NEEDLE 1200 MAGNETIC (NEEDLE) IMPLANT
GLOVE BIO SURGEON STRL SZ 6.5 (GLOVE) ×2 IMPLANT
GLOVE BIO SURGEON STRL SZ7 (GLOVE) ×2 IMPLANT
GOWN STRL REUS W/TWL LRG LVL3 (GOWN DISPOSABLE) ×2 IMPLANT
GOWN STRL REUS W/TWL XL LVL3 (GOWN DISPOSABLE) ×2 IMPLANT
NEEDLE MAYO .5 CIRCLE (NEEDLE) ×2 IMPLANT
NEEDLE SPNL 22GX3.5 QUINCKE BK (NEEDLE) IMPLANT
NS IRRIG 1000ML POUR BTL (IV SOLUTION) ×2 IMPLANT
PACK VAGINAL MINOR WOMEN LF (CUSTOM PROCEDURE TRAY) ×2 IMPLANT
PAD OB MATERNITY 4.3X12.25 (PERSONAL CARE ITEMS) ×2 IMPLANT
PAD PREP 24X48 CUFFED NSTRL (MISCELLANEOUS) ×2 IMPLANT
SUT PROLENE 1 CTX 30  8455H (SUTURE) ×2
SUT PROLENE 1 CTX 30 8455H (SUTURE) ×2 IMPLANT
SYR CONTROL 10ML LL (SYRINGE) IMPLANT
TOWEL OR 17X24 6PK STRL BLUE (TOWEL DISPOSABLE) ×4 IMPLANT
TUBING NON-CON 1/4 X 20 CONN (TUBING) ×2 IMPLANT
WATER STERILE IRR 1000ML POUR (IV SOLUTION) ×2 IMPLANT
YANKAUER SUCT BULB TIP NO VENT (SUCTIONS) ×2 IMPLANT

## 2013-08-19 NOTE — Preoperative (Signed)
Beta Blockers   Reason not to administer Beta Blockers:Not Applicable 

## 2013-08-19 NOTE — Interval H&P Note (Signed)
History and Physical Interval Note:  08/19/2013 12:56 PM  [name]  has presented today for surgery, with the diagnosis of Cerclage, [id number]  The various methods of treatment have been discussed with the patient and family. After consideration of risks, benefits and other options for treatment, the patient has consented to  Procedure(s) with comments: CERCLAGE CERVICAL (N/A) - 1hr OR trime as a surgical intervention .  The patient's history has been reviewed, patient examined, no change in status, stable for surgery.  I have reviewed the patient's chart and labs.  Questions were answered to the patient's satisfaction.     BOVARD,Lynden Carrithers

## 2013-08-19 NOTE — Transfer of Care (Signed)
Immediate Anesthesia Transfer of Care Note  Patient: [name]  Procedure(s) Performed: Procedure(s): CERCLAGE CERVICAL (N/A)  Patient Location: PACU  Anesthesia Type:Spinal  Level of Consciousness: awake, alert  and oriented  Airway & Oxygen Therapy: Patient Spontanous Breathing and Patient connected to nasal cannula oxygen  Post-op Assessment: Report given to PACU RN and Post -op Vital signs reviewed and stable  Post vital signs: Reviewed and stable  Complications: No apparent anesthesia complications

## 2013-08-19 NOTE — Brief Op Note (Signed)
08/19/2013  2:35 PM  PATIENT:  [name]  28 y.o. female  PRE-OPERATIVE DIAGNOSIS:  Incompetent Cervix  POST-OPERATIVE DIAGNOSIS:  Incompetent Cervix  PROCEDURE:  Procedure(s): CERCLAGE CERVICAL (N/A) [employer] cerclage x 2, knot at 12 o'clock  SURGEON:  Surgeon(s) and Role:    * Robbie Rideaux Bovard, MD - Primary  ANESTHESIA:   spinal  EBL:  Total I/O In: 1000 [I.V.:1000] Out: - EBL minimal (<50), uop 100cc  FINDINGS: parous os, FT dilated.    BLOOD ADMINISTERED:none  DRAINS: Urinary Catheter (Foley) for PACU  LOCAL MEDICATIONS USED:  NONE  SPECIMEN:  No Specimen  DISPOSITION OF SPECIMEN:  N/A  COUNTS:  YES  TOURNIQUET:  * No tourniquets in log *  DICTATION: .Other Dictation: Dictation Number [id number]  PLAN OF CARE: Discharge to home after PACU  PATIENT DISPOSITION:  PACU - hemodynamically stable.   Delay start of Pharmacological VTE agent (>24hrs) due to surgical blood loss or risk of bleeding: not applicable

## 2013-08-19 NOTE — Anesthesia Postprocedure Evaluation (Signed)
  Anesthesia Post-op Note  Patient: [name]  Procedure(s) Performed: Procedure(s): CERCLAGE CERVICAL (N/A)  Patient is awake, responsive, moving her legs, and has signs of resolution of her numbness. Pain and nausea are reasonably well controlled. Vital signs are stable and clinically acceptable. Oxygen saturation is clinically acceptable. There are no apparent anesthetic complications at this time. Patient is ready for discharge.

## 2013-08-19 NOTE — Anesthesia Preprocedure Evaluation (Addendum)
Anesthesia Evaluation  Patient identified by MRN, date of birth, ID band Patient awake    Reviewed: Allergy & Precautions, H&P , NPO status , Patient's Chart, lab work & pertinent test results, reviewed documented beta blocker date and time   History of Anesthesia Complications Negative for: history of anesthetic complications  Airway Mallampati: III TM Distance: >3 FB Neck ROM: full    Dental  (+) Teeth Intact   Pulmonary former smoker,  breath sounds clear to auscultation        Cardiovascular negative cardio ROS  Rhythm:regular Rate:Normal     Neuro/Psych negative neurological ROS  negative psych ROS   GI/Hepatic negative GI ROS, Neg liver ROS,   Endo/Other  negative endocrine ROS  Renal/GU negative Renal ROS  negative genitourinary   Musculoskeletal   Abdominal   Peds  Hematology negative hematology ROS (+)   Anesthesia Other Findings   Reproductive/Obstetrics (+) Pregnancy (incompetent cervix)                          Anesthesia Physical  Anesthesia Plan  ASA: II  Anesthesia Plan: Spinal   Post-op Pain Management:    Induction:   Airway Management Planned:   Additional Equipment:   Intra-op Plan:   Post-operative Plan:   Informed Consent: I have reviewed the patients History and Physical, chart, labs and discussed the procedure including the risks, benefits and alternatives for the proposed anesthesia with the patient or authorized representative who has indicated his/her understanding and acceptance.     Plan Discussed with: CRNA and Surgeon  Anesthesia Plan Comments:         Anesthesia Quick Evaluation

## 2013-08-19 NOTE — Discharge Instructions (Signed)
Preterm Labor Information Preterm labor is when labor starts before you are [redacted] weeks pregnant. The normal length of pregnancy is 39 to 41 weeks.  CAUSES  The cause of preterm labor is not often known. The most common known cause is infection. RISK FACTORS  Having a history of preterm labor.  Having your water break before it should.  Having a placenta that covers the opening of the cervix.  Having a placenta that breaks away from the uterus.  Having a cervix that is too weak to hold the baby in the uterus.  Having too much fluid in the amniotic sac.  Taking drugs or smoking while pregnant.  Not gaining enough weight while pregnant.  Being younger than 24 and older than 26 years old.  Having a low income.  Being African American. SYMPTOMS  Period-like cramps, belly (abdominal) pain, or back pain.  Contractions that are regular, as often as six in an hour. They may be mild or painful.  Contractions that start at the top of the belly. They then move to the lower belly and back.  Lower belly pressure that seems to get stronger.  Bleeding from the vagina.  Fluid leaking from the vagina. TREATMENT  Treatment depends on:  Your condition.  The condition of your baby.  How many weeks pregnant you are. Your doctor may have you:  Take medicine to stop contractions.  Stay in bed except to use the restroom (bed rest).  Stay in the hospital. WHAT SHOULD YOU DO IF YOU THINK YOU ARE IN PRETERM LABOR? Call your doctor right away. You need to go to the hospital right away.  HOW CAN YOU PREVENT PRETERM LABOR IN FUTURE PREGNANCIES?  Stop smoking, if you smoke.  Maintain healthy weight gain.  Do not take drugs or be around chemicals that are not needed.  Tell your doctor if you think you have an infection.  Tell your doctor if you had a preterm labor before. Document Released: 09/02/2008 Document Revised: 03/27/2013 Document Reviewed: 09/02/2008 [facility] Patient  Information 2014 [place], [place].  Cerclage Cerclage of the cervix is a surgical procedure for an incompetent cervix. An incompetent cervix is a weak cervix that opens up before labor begins. Cerclage of the cervix sews the cervix closed during pregnancy.  LET [facility] CARE PROVIDER KNOW ABOUT:   Any allergies you have.  All medicines you are taking, including vitamins, herbs, eye drops, creams, and over-the-counter medicines.  Previous problems you or members of your family have had with the use of anesthetics.  Any blood disorders you have.  Previous surgeries you have had.  Medical conditions you have.  Any recent colds or infections. RISKS AND COMPLICATIONS  Generally, this is a safe procedure. However, as with any procedure, complications can occur. Possible complications include:  Infection.  Bleeding.  Rupturing the amniotic sac (membranes).  Going into early labor and delivery.  Problems with the anesthesia.  Infection of the amniotic sac. BEFORE THE PROCEDURE   Ask your health care provider about changing or stopping your medicines.  Do not eat or drink anything for 6 8 hours before the procedure.  Arrange for someone to drive you home after the procedure. PROCEDURE   An IV tube will be placed in your vein. You will be given a sedative to help you relax. You will be given a medicine that makes you sleep through the procedure (general anesthetic) or a medicine injected into your spine that numbs your body below the waist (spinal  or epidural anesthetic). You will be asleep or be numbed through the entire procedure.  A speculum will be placed in vagina to visualize your cervix.  The cervix is then grasped and stitched closed tightly.  Ultrasound may be used to guide the procedure and monitor the baby. AFTER THE PROCEDURE   You will go to a recovery room where you and your unborn baby are monitored. Once you are awake, stable, and taking fluids well, you will  be allowed to return to your room.  You will usually stay in the hospital overnight.  You may get an injection of progesterone to prevent uterine contractions.  You may be given pain-relieving medicines to take with you when you go home.  Have someone drive you home and stay with you for up to 2 days. Document Released: 05/19/2008 Document Revised: 02/06/2013 Document Reviewed: 12/26/2012 [facility] Patient Information 2014 [place]

## 2013-08-19 NOTE — Anesthesia Procedure Notes (Signed)
Spinal  Patient location during procedure: OR Preanesthetic Checklist Completed: patient identified, site marked, surgical consent, pre-op evaluation, timeout performed, IV checked, risks and benefits discussed and monitors and equipment checked Spinal Block Patient position: sitting Prep: DuraPrep Patient monitoring: heart rate, cardiac monitor, continuous pulse ox and blood pressure Approach: midline Location: L3-4 Injection technique: single-shot Needle Needle type: Sprotte  Needle gauge: 24 G Needle length: 9 cm Assessment Sensory level: T4 Additional Notes Spinal Dosage in OR  5% Xylocaine ml       1.2

## 2013-08-20 ENCOUNTER — Ambulatory Visit: Admit: 2013-08-20 | Payer: Self-pay | Admitting: Obstetrics and Gynecology

## 2013-08-20 ENCOUNTER — Inpatient Hospital Stay (HOSPITAL_COMMUNITY): Admit: 2013-08-20 | Payer: Self-pay | Admitting: Obstetrics and Gynecology

## 2013-08-20 ENCOUNTER — Encounter (HOSPITAL_COMMUNITY): Payer: Self-pay | Admitting: Obstetrics and Gynecology

## 2013-08-20 NOTE — Op Note (Signed)
[name]:  [place], [place]         ACCOUNT NO.:  [id number]  MEDICAL RECORD NO.:  [id number]  LOCATION:  [facility]                          [facility]  PHYSICIAN:  Sherron MondayJody Bovard, MD        DATE OF BIRTH:  09/16/1985  DATE OF PROCEDURE:  08/19/2013 DATE OF DISCHARGE:  08/19/2013                              OPERATIVE REPORT   PREOPERATIVE DIAGNOSIS:  A 28 year old, G6, P2-0-3-2, at 22+ weeks with incompetent cervix, history of loss at approximately 18 weeks after rescue cerclage failed.  POSTOPERATIVE DIAGNOSIS:  A 28 year old, G6, P2-0-3-2, at 22+ weeks with incompetent cervix, history of loss at approximately 18 weeks after a rescue cerclage failed.  Status post McDonald cerclage x2 within knots at 12 o'clock.  SURGEON:  Sherron MondayJody Bovard, M.D.  ANESTHESIA:  Spinal.  ESTIMATED BLOOD LOSS:  Minimal.  IV FLUIDS:  1200 mL.  URINE OUTPUT:  100 mL, clear urine at the end of the procedure in the Foley.  FINDINGS:  Parous os, approximately fingertip dilated.  COMPLICATIONS:  None.  PATHOLOGY:  None.  PROCEDURE:  After informed consent was reviewed with the patient, she was transported to the operating room, where spinal anesthesia was in place and found to be adequate.  She was then placed in the supine position with a leftward tilt.  Once, the spinal had taken effect, she was placed in the [place] stirrups, prepped and draped in the normal sterile fashion.  Using an open-sided speculum, her cervix was easily visualized and McDonald cerclage was placed close to the cervical vaginal junction as it could be in a pursestring fashion, 4-5 sutures with 0 Prolene.  After this was placed using heavy weighted speculum and  Deaver retractor, a 2nd suture closer to the cervical vaginal junction was placed approximately 0.5 cm above the 1st with 4-5 bites.  Again knot at 12 o'clock.  There was not noted to be any active bleeding.  This was again tied at 12 o'clock. Sponge lap and needle counts  were correct x2 per the operating room staff.  The patient tolerated the procedure well.  She was returned to the supine position and incised with the procedure.     Sherron MondayJody Bovard, MD     JB/MEDQ  D:  08/19/2013  T:  08/20/2013  Job:  [id number]

## 2013-09-01 ENCOUNTER — Encounter (HOSPITAL_COMMUNITY): Payer: Self-pay | Admitting: *Deleted

## 2013-09-01 ENCOUNTER — Inpatient Hospital Stay (HOSPITAL_COMMUNITY)
Admission: AD | Admit: 2013-09-01 | Discharge: 2013-09-02 | Disposition: A | Discharge status: Home / Self Care | Payer: Medicaid Other | Source: Ambulatory Visit | Attending: Obstetrics and Gynecology | Admitting: Obstetrics and Gynecology

## 2013-09-01 DIAGNOSIS — O9989 Other specified diseases and conditions complicating pregnancy, childbirth and the puerperium: Principal | ICD-10-CM

## 2013-09-01 DIAGNOSIS — R1031 Right lower quadrant pain: Secondary | ICD-10-CM | POA: Insufficient documentation

## 2013-09-01 DIAGNOSIS — N949 Unspecified condition associated with female genital organs and menstrual cycle: Secondary | ICD-10-CM

## 2013-09-01 DIAGNOSIS — O99891 Other specified diseases and conditions complicating pregnancy: Secondary | ICD-10-CM

## 2013-09-01 LAB — URINALYSIS, ROUTINE W REFLEX MICROSCOPIC
BILIRUBIN URINE: NEGATIVE
Glucose, UA: NEGATIVE mg/dL
Hgb urine dipstick: NEGATIVE
KETONES UR: NEGATIVE mg/dL
Leukocytes, UA: NEGATIVE
Nitrite: NEGATIVE
PROTEIN: NEGATIVE mg/dL
Specific Gravity, Urine: <1.005 — ABNORMAL LOW (ref 1.005–1.030)
Urobilinogen, UA: 0.2 mg/dL (ref 0.0–1.0)
pH: 6.5 (ref 5.0–8.0)

## 2013-09-01 LAB — CBC
HEMATOCRIT: 31.6 % — AB (ref 36.0–46.0)
HEMOGLOBIN: 11.1 g/dL — AB (ref 12.0–15.0)
MCH: 28.3 pg (ref 26.0–34.0)
MCHC: 35.1 g/dL (ref 30.0–36.0)
MCV: 80.6 fL (ref 78.0–100.0)
Platelets: 232 10*3/uL (ref 150–400)
RBC: 3.92 MIL/uL (ref 3.87–5.11)
RDW: 13.5 % (ref 11.5–15.5)
WBC: 10.1 10*3/uL (ref 4.0–10.5)

## 2013-09-01 MED ORDER — ACETAMINOPHEN 500 MG PO TABS
1000.0000 mg | ORAL_TABLET | Freq: Once | ORAL | Status: AC
Start: 2013-09-01 — End: 2013-09-01
  Administered 2013-09-01: 1000 mg via ORAL
  Filled 2013-09-01: qty 2

## 2013-09-01 NOTE — Discharge Instructions (Signed)
Second Trimester of Pregnancy The second trimester is from week 13 through week 28, months 4 through 6. The second trimester is often a time when you feel your best. Your body has also adjusted to being pregnant, and you begin to feel better physically. Usually, morning sickness has lessened or quit completely, you may have more energy, and you may have an increase in appetite. The second trimester is also a time when the fetus is growing rapidly. At the end of the sixth month, the fetus is about 9 inches long and weighs about 1 pounds. You will likely begin to feel the baby move (quickening) between 18 and 20 weeks of the pregnancy. BODY CHANGES Your body goes through many changes during pregnancy. The changes vary from woman to woman.   Your weight will continue to increase. You will notice your lower abdomen bulging out.  You may begin to get stretch marks on your hips, abdomen, and breasts.  You may develop headaches that can be relieved by medicines approved by your caregiver.  You may urinate more often because the fetus is pressing on your bladder.  You may develop or continue to have heartburn as a result of your pregnancy.  You may develop constipation because certain hormones are causing the muscles that push waste through your intestines to slow down.  You may develop hemorrhoids or swollen, bulging veins (varicose veins).  You may have back pain because of the weight gain and pregnancy hormones relaxing your joints between the bones in your pelvis and as a result of a shift in weight and the muscles that support your balance.  Your breasts will continue to grow and be tender.  Your gums may bleed and may be sensitive to brushing and flossing.  Dark spots or blotches (chloasma, mask of pregnancy) may develop on your face. This will likely fade after the baby is born.  A dark line from your belly button to the pubic area (linea nigra) may appear. This will likely fade after the  baby is born. WHAT TO EXPECT AT YOUR PRENATAL VISITS During a routine prenatal visit:  You will be weighed to make sure you and the fetus are growing normally.  Your blood pressure will be taken.  Your abdomen will be measured to track your baby's growth.  The fetal heartbeat will be listened to.  Any test results from the previous visit will be discussed. Your caregiver may ask you:  How you are feeling.  If you are feeling the baby move.  If you have had any abnormal symptoms, such as leaking fluid, bleeding, severe headaches, or abdominal cramping.  If you have any questions. Other tests that may be performed during your second trimester include:  Blood tests that check for:  Low iron levels (anemia).  Gestational diabetes (between 24 and 28 weeks).  Rh antibodies.  Urine tests to check for infections, diabetes, or protein in the urine.  An ultrasound to confirm the proper growth and development of the baby.  An amniocentesis to check for possible genetic problems.  Fetal screens for spina bifida and Down syndrome. HOME CARE INSTRUCTIONS   Avoid all smoking, herbs, alcohol, and unprescribed drugs. These chemicals affect the formation and growth of the baby.  Follow your caregiver's instructions regarding medicine use. There are medicines that are either safe or unsafe to take during pregnancy.  Exercise only as directed by your caregiver. Experiencing uterine cramps is a good sign to stop exercising.  Continue to eat regular,   healthy meals.  Wear a good support bra for breast tenderness.  Do not use hot tubs, steam rooms, or saunas.  Wear your seat belt at all times when driving.  Avoid raw meat, uncooked cheese, cat litter boxes, and soil used by cats. These carry germs that can cause birth defects in the baby.  Take your prenatal vitamins.  Try taking a stool softener (if your caregiver approves) if you develop constipation. Eat more high-fiber foods,  such as fresh vegetables or fruit and whole grains. Drink plenty of fluids to keep your urine clear or pale yellow.  Take warm sitz baths to soothe any pain or discomfort caused by hemorrhoids. Use hemorrhoid cream if your caregiver approves.  If you develop varicose veins, wear support hose. Elevate your feet for 15 minutes, 3 4 times a day. Limit salt in your diet.  Avoid heavy lifting, wear low heel shoes, and practice good posture.  Rest with your legs elevated if you have leg cramps or low back pain.  Visit your dentist if you have not gone yet during your pregnancy. Use a soft toothbrush to brush your teeth and be gentle when you floss.  A sexual relationship may be continued unless your caregiver directs you otherwise.  Continue to go to all your prenatal visits as directed by your caregiver. SEEK MEDICAL CARE IF:   You have dizziness.  You have mild pelvic cramps, pelvic pressure, or nagging pain in the abdominal area.  You have persistent nausea, vomiting, or diarrhea.  You have a bad smelling vaginal discharge.  You have pain with urination. SEEK IMMEDIATE MEDICAL CARE IF:   You have a fever.  You are leaking fluid from your vagina.  You have spotting or bleeding from your vagina.  You have severe abdominal cramping or pain.  You have rapid weight gain or loss.  You have shortness of breath with chest pain.  You notice sudden or extreme swelling of your face, hands, ankles, feet, or legs.  You have not felt your baby move in over an hour.  You have severe headaches that do not go away with medicine.  You have vision changes. Document Released: 05/31/2001 Document Revised: 02/06/2013 Document Reviewed: 08/07/2012 [employer] Patient Information 2014 [employer].  

## 2013-09-01 NOTE — MAU Note (Signed)
Pt G4 P2 at 23.6wks with RLQ pain all day.  Denies bleeding, fever, or vomiting.

## 2013-09-01 NOTE — MAU Provider Note (Signed)
History     CSN: [id number]  Arrival date and time: 09/01/13 2158   None     Chief Complaint  Patient presents with  . Abdominal Pain   HPI  [name] is a 28 y.o. O1H0865 at [redacted]w[redacted]d who presents today with 8/10 RLQ pain that started this morning. She denies any VB, LOF, UCs, nausea or vomiting. She states that   Past Medical History  Diagnosis Date  . 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    Past Surgical History  Procedure Laterality Date  . Cholecystectomy  2008  . Cervical cerclage    . Cervical cerclage  03/30/2012    Procedure: CERCLAGE CERVICAL;  Surgeon: Sherron Monday, MD;  Location: [facility];  Service: Gynecology;  Laterality: N/A;  . Dilation and evacuation  04/07/2012    Procedure: DILATATION AND EVACUATION;  Surgeon: Oliver Pila, MD;  Location: [facility];  Service: Gynecology;  Laterality: N/A;  . Cervical cerclage N/A 08/19/2013    Procedure: CERCLAGE CERVICAL;  Surgeon: Sherron Monday, MD;  Location: [facility];  Service: Gynecology;  Laterality: N/A;    Family History  Problem Relation Age of Onset  . Diabetes Mother   . Hypertension Mother   . Kidney disease Mother   . Heart disease Mother     History  Substance Use Topics  . Smoking status: Former Smoker -- 0.25 packs/day for 2 years    Types: Cigarettes    Quit date: 08/15/2010  . Smokeless tobacco: Never Used  . Alcohol Use: No    Allergies: No Known Allergies  Prescriptions prior to admission  Medication Sig Dispense Refill  . ibuprofen (MOTRIN IB) 800 MG tablet Take 1 tablet (800 mg total) by mouth every 8 (eight) hours. For 24-36 hours  10 tablet  0  . progesterone (PROMETRIUM) 200 MG capsule Take 200 mg by mouth at bedtime.        ROS Physical Exam   Blood pressure 137/65, pulse 85, temperature 99 F (37.2 C), temperature source Oral, resp. rate 18, height 5\' 8"  (1.727 m), weight 100.699 kg (222 lb), last menstrual period 03/18/2013.  Physical  Exam  MAU Course  Procedures  Results for orders placed during the hospital encounter of 09/01/13 (from the past 24 hour(s))  URINALYSIS, ROUTINE W REFLEX MICROSCOPIC     Status: Abnormal   Collection Time    09/01/13 10:20 PM      Result Value Ref Range   Color, Urine YELLOW  YELLOW   APPearance CLEAR  CLEAR   Specific Gravity, Urine <1.005 (*) 1.005 - 1.030   pH 6.5  5.0 - 8.0   Glucose, UA NEGATIVE  NEGATIVE mg/dL   Hgb urine dipstick NEGATIVE  NEGATIVE   Bilirubin Urine NEGATIVE  NEGATIVE   Ketones, ur NEGATIVE  NEGATIVE mg/dL   Protein, ur NEGATIVE  NEGATIVE mg/dL   Urobilinogen, UA 0.2  0.0 - 1.0 mg/dL   Nitrite NEGATIVE  NEGATIVE   Leukocytes, UA NEGATIVE  NEGATIVE  CBC     Status: Abnormal   Collection Time    09/01/13 10:30 PM      Result Value Ref Range   WBC 10.1  4.0 - 10.5 K/uL   RBC 3.92  3.87 - 5.11 MIL/uL   Hemoglobin 11.1 (*) 12.0 - 15.0 g/dL   HCT 78.4 (*) 69.6 - 29.5 %   MCV 80.6  78.0 - 100.0 fL   MCH 28.3  26.0 - 34.0 pg  MCHC 35.1  30.0 - 36.0 g/dL   RDW 63.113.5  49.711.5 - 02.615.5 %   Platelets 232  150 - 400 K/uL    2343: D/W Dr. Ellyn HackBovard: will dc home at this time. Likely round ligament pain, but will have the patient FU with the office this week.  Assessment and Plan   1. Round ligament pain   comfort measures reviewed Urine Cx pending Second trimester precautions, and preterm labor danger signs reviewed Return to MAU as needed  Follow-up Information   Follow up with BOVARD,JODY, MD. (call for an appointment this week. )    Specialty:  Obstetrics and Gynecology   Contact information:   [address] [place] [address] [phone]        [name], [name] 09/01/2013, 10:26 PM

## 2013-09-01 NOTE — MAU Note (Signed)
Pt. Having lower abdominal cramping that began this am. Denies leakage of fluid or bleeding. Denies discharge. Baby is moving well. Denies vomiting or diarrhea. Cerclage was placed March 2. Next appointment with OB is in 2 weeks.

## 2013-09-03 LAB — URINE CULTURE
COLONY COUNT: NO GROWTH
Culture: NO GROWTH

## 2013-12-06 ENCOUNTER — Encounter (HOSPITAL_COMMUNITY): Payer: Self-pay | Admitting: *Deleted

## 2013-12-06 ENCOUNTER — Inpatient Hospital Stay (HOSPITAL_COMMUNITY)
Admission: AD | Admit: 2013-12-06 | Discharge: 2013-12-08 | DRG: 775 | Disposition: A | Discharge status: Home / Self Care | Payer: Medicaid Other | Source: Ambulatory Visit | Attending: Obstetrics and Gynecology | Admitting: Obstetrics and Gynecology

## 2013-12-06 DIAGNOSIS — O3430 Maternal care for cervical incompetence, unspecified trimester: Principal | ICD-10-CM | POA: Diagnosis present

## 2013-12-06 DIAGNOSIS — Z87891 Personal history of nicotine dependence: Secondary | ICD-10-CM

## 2013-12-06 DIAGNOSIS — Z833 Family history of diabetes mellitus: Secondary | ICD-10-CM

## 2013-12-06 DIAGNOSIS — IMO0001 Reserved for inherently not codable concepts without codable children: Secondary | ICD-10-CM

## 2013-12-06 DIAGNOSIS — O99892 Other specified diseases and conditions complicating childbirth: Secondary | ICD-10-CM | POA: Diagnosis present

## 2013-12-06 DIAGNOSIS — Z8249 Family history of ischemic heart disease and other diseases of the circulatory system: Secondary | ICD-10-CM

## 2013-12-06 DIAGNOSIS — O9989 Other specified diseases and conditions complicating pregnancy, childbirth and the puerperium: Secondary | ICD-10-CM

## 2013-12-06 DIAGNOSIS — Z22330 Carrier of Group B streptococcus: Secondary | ICD-10-CM

## 2013-12-06 LAB — TYPE AND SCREEN
ABO/RH(D): O POS
Antibody Screen: NEGATIVE

## 2013-12-06 LAB — OB RESULTS CONSOLE HIV ANTIBODY (ROUTINE TESTING): HIV: NONREACTIVE

## 2013-12-06 LAB — CBC
HCT: 32.5 % — ABNORMAL LOW (ref 36.0–46.0)
Hemoglobin: 10.5 g/dL — ABNORMAL LOW (ref 12.0–15.0)
MCH: 24 pg — ABNORMAL LOW (ref 26.0–34.0)
MCHC: 32.3 g/dL (ref 30.0–36.0)
MCV: 74.2 fL — AB (ref 78.0–100.0)
PLATELETS: 247 10*3/uL (ref 150–400)
RBC: 4.38 MIL/uL (ref 3.87–5.11)
RDW: 14.4 % (ref 11.5–15.5)
WBC: 12.1 10*3/uL — ABNORMAL HIGH (ref 4.0–10.5)

## 2013-12-06 LAB — RPR

## 2013-12-06 LAB — OB RESULTS CONSOLE ABO/RH: RH TYPE: POSITIVE

## 2013-12-06 LAB — OB RESULTS CONSOLE GC/CHLAMYDIA
CHLAMYDIA, DNA PROBE: NEGATIVE
Gonorrhea: NEGATIVE

## 2013-12-06 LAB — OB RESULTS CONSOLE RPR: RPR: NONREACTIVE

## 2013-12-06 LAB — OB RESULTS CONSOLE RUBELLA ANTIBODY, IGM: Rubella: IMMUNE

## 2013-12-06 LAB — OB RESULTS CONSOLE GBS: GBS: POSITIVE

## 2013-12-06 LAB — OB RESULTS CONSOLE HEPATITIS B SURFACE ANTIGEN: Hepatitis B Surface Ag: NEGATIVE

## 2013-12-06 MED ORDER — FLEET ENEMA 7-19 GM/118ML RE ENEM: 1.0000 | ENEMA | RECTAL | Status: DC | PRN

## 2013-12-06 MED ORDER — LACTATED RINGERS IV SOLN: 500.0000 mL | INTRAVENOUS | Status: DC | PRN

## 2013-12-06 MED ORDER — WITCH HAZEL-GLYCERIN EX PADS: 1.0000 "application " | MEDICATED_PAD | CUTANEOUS | Status: DC | PRN

## 2013-12-06 MED ORDER — OXYCODONE-ACETAMINOPHEN 5-325 MG PO TABS: 1.0000 | ORAL_TABLET | ORAL | Status: DC | PRN

## 2013-12-06 MED ORDER — IBUPROFEN 600 MG PO TABS
600.0000 mg | ORAL_TABLET | Freq: Four times a day (QID) | ORAL | Status: DC
  Administered 2013-12-06 – 2013-12-08 (×8): 600 mg via ORAL
  Filled 2013-12-06 (×8): qty 1

## 2013-12-06 MED ORDER — ONDANSETRON HCL 4 MG/2ML IJ SOLN: 4.0000 mg | INTRAMUSCULAR | Status: DC | PRN

## 2013-12-06 MED ORDER — TETANUS-DIPHTH-ACELL PERTUSSIS 5-2.5-18.5 LF-MCG/0.5 IM SUSP: 0.5000 mL | Freq: Once | INTRAMUSCULAR | Status: DC

## 2013-12-06 MED ORDER — OXYTOCIN 10 UNIT/ML IJ SOLN
INTRAMUSCULAR | Status: AC
  Filled 2013-12-06: qty 1

## 2013-12-06 MED ORDER — IBUPROFEN 600 MG PO TABS
600.0000 mg | ORAL_TABLET | Freq: Four times a day (QID) | ORAL | Status: DC | PRN
  Administered 2013-12-06: 600 mg via ORAL
  Filled 2013-12-06: qty 1

## 2013-12-06 MED ORDER — PRENATAL MULTIVITAMIN CH
1.0000 | ORAL_TABLET | Freq: Every day | ORAL | Status: DC
  Filled 2013-12-06 (×2): qty 1

## 2013-12-06 MED ORDER — LIDOCAINE HCL (PF) 1 % IJ SOLN
30.0000 mL | INTRAMUSCULAR | Status: DC | PRN
  Filled 2013-12-06: qty 30

## 2013-12-06 MED ORDER — SENNOSIDES-DOCUSATE SODIUM 8.6-50 MG PO TABS
2.0000 | ORAL_TABLET | ORAL | Status: DC
  Administered 2013-12-06 – 2013-12-08 (×2): 2 via ORAL
  Filled 2013-12-06 (×2): qty 2

## 2013-12-06 MED ORDER — MAGNESIUM HYDROXIDE 400 MG/5ML PO SUSP: 30.0000 mL | ORAL | Status: DC | PRN

## 2013-12-06 MED ORDER — ZOLPIDEM TARTRATE 5 MG PO TABS
5.0000 mg | ORAL_TABLET | Freq: Every evening | ORAL | Status: DC | PRN
Start: 2013-12-06 — End: 2013-12-08

## 2013-12-06 MED ORDER — METHYLERGONOVINE MALEATE 0.2 MG PO TABS: 0.2000 mg | ORAL_TABLET | ORAL | Status: DC | PRN

## 2013-12-06 MED ORDER — METHYLERGONOVINE MALEATE 0.2 MG/ML IJ SOLN: 0.2000 mg | INTRAMUSCULAR | Status: DC | PRN

## 2013-12-06 MED ORDER — LACTATED RINGERS IV SOLN
INTRAVENOUS | Status: DC
  Administered 2013-12-06: 08:00:00 via INTRAVENOUS

## 2013-12-06 MED ORDER — SIMETHICONE 80 MG PO CHEW: 80.0000 mg | CHEWABLE_TABLET | ORAL | Status: DC | PRN

## 2013-12-06 MED ORDER — DIBUCAINE 1 % RE OINT: 1.0000 "application " | TOPICAL_OINTMENT | RECTAL | Status: DC | PRN

## 2013-12-06 MED ORDER — OXYCODONE-ACETAMINOPHEN 5-325 MG PO TABS
1.0000 | ORAL_TABLET | ORAL | Status: DC | PRN
  Administered 2013-12-06: 1 via ORAL
  Filled 2013-12-06: qty 1

## 2013-12-06 MED ORDER — CITRIC ACID-SODIUM CITRATE 334-500 MG/5ML PO SOLN: 30.0000 mL | ORAL | Status: DC | PRN

## 2013-12-06 MED ORDER — ACETAMINOPHEN 325 MG PO TABS: 650.0000 mg | ORAL_TABLET | ORAL | Status: DC | PRN

## 2013-12-06 MED ORDER — BENZOCAINE-MENTHOL 20-0.5 % EX AERO: 1.0000 "application " | INHALATION_SPRAY | CUTANEOUS | Status: DC | PRN

## 2013-12-06 MED ORDER — OXYTOCIN 40 UNITS IN LACTATED RINGERS INFUSION - SIMPLE MED
62.5000 mL/h | INTRAVENOUS | Status: DC
  Administered 2013-12-06: 62.5 mL/h via INTRAVENOUS
  Filled 2013-12-06: qty 1000

## 2013-12-06 MED ORDER — MEASLES, MUMPS & RUBELLA VAC ~~LOC~~ INJ: 0.5000 mL | INJECTION | Freq: Once | SUBCUTANEOUS | Status: DC

## 2013-12-06 MED ORDER — SODIUM CHLORIDE 0.9 % IV SOLN
2.0000 g | Freq: Once | INTRAVENOUS | Status: AC
  Administered 2013-12-06: 2 g via INTRAVENOUS
  Filled 2013-12-06: qty 2000

## 2013-12-06 MED ORDER — DIPHENHYDRAMINE HCL 25 MG PO CAPS: 25.0000 mg | ORAL_CAPSULE | Freq: Four times a day (QID) | ORAL | Status: DC | PRN

## 2013-12-06 MED ORDER — ONDANSETRON HCL 4 MG PO TABS: 4.0000 mg | ORAL_TABLET | ORAL | Status: DC | PRN

## 2013-12-06 MED ORDER — OXYTOCIN BOLUS FROM INFUSION
500.0000 mL | INTRAVENOUS | Status: DC
  Administered 2013-12-06: 500 mL via INTRAVENOUS

## 2013-12-06 MED ORDER — ONDANSETRON HCL 4 MG/2ML IJ SOLN: 4.0000 mg | Freq: Four times a day (QID) | INTRAMUSCULAR | Status: DC | PRN

## 2013-12-06 MED ORDER — LANOLIN HYDROUS EX OINT: TOPICAL_OINTMENT | CUTANEOUS | Status: DC | PRN

## 2013-12-06 NOTE — MAU Note (Signed)
Pt presents to MAU with complaints of contractions that started at 5am. Denies any vaginal bleeding or LOF.  

## 2013-12-06 NOTE — Progress Notes (Signed)
Patient ID: [name], female   DOB: [birth year], 28 y.o.   MRN: [id number] D/w pt her wish for a postpartum sterilization. She did not receive an epidural and does not want a spinal.  D/w her anesthesia's concerns about pp BTL under general anesthesia.  D/w pt options of ppBTL with spinal, Interval tubal, or Essure procedure.  She would like to have an Essure procedure.  We will get set up/  She also plans circumcision in the office.

## 2013-12-06 NOTE — Lactation Note (Signed)
This note was copied from the chart of Natalie Aquanetta Esau. Lactation Consultation Note  Patient Name: [name] FAOZH'YToday's Date: 12/06/2013 Reason for consult: Other (Comment) (formula exclusion)   Maternal Data Formula Feeding for Exclusion: Yes Reason for exclusion: Mother's choice to formula feed on admision  Feeding    LATCH Score/Interventions                      Lactation Tools Discussed/Used     Consult Status      [name], [name] 12/06/2013, 3:01 PM

## 2013-12-06 NOTE — H&P (Signed)
Vic BlackbirdVontessa Hillhouse is a 28 y.o. female [id number] at 37+ weeks (EDD 12/22/13 by LMP c/w 7 week US) presenting for labor and cervical dilation to 7 cm.  Prenatal care complicated by a history of incompetent cervix and 18 week loss last pregnancy.  She cam late to care at 18 weeks and cervix was initially WNL.  She declined a prophylactic cerclage, then started to have funneling at 20 weeks and ultimately agreed to the cerclage at 21 weeks.  SHe had those placed and then removed at 36 weeks,  She also is GBS positive and will receive ampicillin given her advanced dilation.  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   Maternal Medical History:  Reason for admission: Contractions.   Contractions: Onset was 3-5 hours ago.   Frequency: regular.   Perceived severity is strong.    Fetal activity: Perceived fetal activity is normal.    Prenatal Complications - Diabetes: none.    Past OB Hx 2007 SAB 2008 NSVD 7#8oz 2011 NSVD 7#15oz 2012 SAB 2013 18 week loss, retained placenta with D&E  Past Medical History  Diagnosis Date  . 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   Past Surgical History  Procedure Laterality Date  . Cholecystectomy  2008  . Cervical cerclage    . Cervical cerclage  03/30/2012    Procedure: CERCLAGE CERVICAL;  Surgeon: Sherron MondayJody Bovard, MD;  Location: [facility];  Service: Gynecology;  Laterality: N/A;  . Dilation and evacuation  04/07/2012    Procedure: DILATATION AND EVACUATION;  Surgeon: Oliver PilaKathy W Jerauld Bostwick, MD;  Location: [facility];  Service: Gynecology;  Laterality: N/A;  . Cervical cerclage N/A 08/19/2013    Procedure: CERCLAGE CERVICAL;  Surgeon: Sherron MondayJody Bovard, MD;  Location: [facility];  Service: Gynecology;  Laterality: N/A;   Family History: family history includes Diabetes in her mother; Heart disease in her mother; Hypertension in her mother; Kidney disease in her mother. Social History:  reports that she quit smoking about 3 years ago. Her smoking use included  Cigarettes. She has a .5 pack-year smoking history. She has never used smokeless tobacco. She reports that she does not drink alcohol or use illicit drugs.   Prenatal Transfer Tool  Maternal Diabetes: No Genetic Screening: late to care Maternal Ultrasounds/Referrals: Abnormal:  Findings:   Other:funneled cervix Fetal Ultrasounds or other Referrals:  None Maternal Substance Abuse:  No Significant Maternal Medications:  None Significant Maternal Lab Results:  Lab values include: Group B Strep positive Other Comments:  None  ROS    Blood pressure 124/87, pulse 86, resp. rate 18, height 5\' 7"  (1.702 m), weight 111.585 kg (246 lb), last menstrual period 03/18/2013. Maternal Exam:  Uterine Assessment: Contraction frequency is regular.   Abdomen: Fetal presentation: vertex  Introitus: Normal vulva. Normal vagina.    Physical Exam  Constitutional: She appears well-developed and well-nourished.  Cardiovascular: Normal rate and regular rhythm.   Respiratory: Effort normal.  GI: Soft.  Genitourinary: Vagina normal and uterus normal.  Neurological: She is alert.  Psychiatric: She has a normal mood and affect. Her behavior is normal.    Prenatal labs: ABO, Rh:  O positive Antibody:  Ab neg Rubella:  Immune RPR:   NR HBsAg:   Neg HIV:   NR GBS:   Positive One hour GTT 99   Assessment/Plan: Pt admitted in advanced labor.  Will receive ampicillin for +GBS.  Desired pp BTL, will d/w her  Oliver PilaRICHARDSON,Mariaceleste Herrera W 12/06/2013, 7:33 AM

## 2013-12-07 ENCOUNTER — Encounter (HOSPITAL_COMMUNITY): Payer: Self-pay

## 2013-12-07 NOTE — Discharge Summary (Signed)
Obstetric Discharge Summary Reason for Admission: onset of labor Prenatal Procedures: none Intrapartum Procedures: spontaneous vaginal delivery Postpartum Procedures: none Complications-Operative and Postpartum: first degree perineal laceration (hemostatic and not repaired) Hemoglobin  Date Value Ref Range Status  12/06/2013 10.5* 12.0 - 15.0 g/dL Final     HCT  Date Value Ref Range Status  12/06/2013 32.5* 36.0 - 46.0 % Final    Physical Exam:  General: alert and cooperative Lochia: appropriate Uterine Fundus: firm   Discharge Diagnoses: Term Pregnancy-delivered  Discharge Information: Date: 12/08/2013 Activity: pelvic rest Diet: routine Medications: Ibuprofen Condition: improved Instructions: refer to practice specific booklet Discharge to: home Follow-up Information   Follow up with Oliver PilaICHARDSON,[name], MD In 6 weeks. (We will contact you with appointment for postpartum and Essure)    Specialty:  Obstetrics and Gynecology   Contact information:   [address] [place] [address] [phone]       Newborn Data: Live born female  Birth Weight: 5 lb 12.2 oz (2615 g) APGAR: 9, 9  Home with mother.  Oliver PilaICHARDSON,[name] 12/08/2013, 10:16 AM

## 2013-12-07 NOTE — Progress Notes (Signed)
Post Partum Day 1 Subjective: no complaints and tolerating PO  Objective: Blood pressure 120/78, pulse 81, temperature 98 F (36.7 C), temperature source Oral, resp. rate 18, height 5\' 7"  (1.702 m), weight 111.585 kg (246 lb), last menstrual period 03/18/2013, unknown if currently breastfeeding.  Physical Exam:  General: alert and cooperative Lochia: appropriate Uterine Fundus: firm    Recent Labs  12/06/13 0840  HGB 10.5*  HCT 32.5*    Assessment/Plan: Plan for discharge tomorrow   LOS: 1 day   [name],Natalie W 12/07/2013, 10:36 AM

## 2013-12-08 MED ORDER — IBUPROFEN 600 MG PO TABS: 600.0000 mg | ORAL_TABLET | Freq: Four times a day (QID) | ORAL | Status: DC

## 2013-12-08 NOTE — Progress Notes (Signed)
Post Partum Day 2 Subjective: no complaints and tolerating PO  Objective: Blood pressure 117/61, pulse 78, temperature 97.9 F (36.6 C), temperature source Oral, resp. rate 18, height 5\' 7"  (1.702 m), weight 111.585 kg (246 lb), last menstrual period 03/18/2013, unknown if currently breastfeeding.  Physical Exam:  General: alert and cooperative Lochia: appropriate Uterine Fundus: firm    Recent Labs  12/06/13 0840  HGB 10.5*  HCT 32.5*    Assessment/Plan: Discharge home Motrin Will schedule Essure in office Circumcision to be scheduled in office   LOS: 2 days   RICHARDSON,[name] 12/08/2013, 10:16 AM

## 2013-12-09 NOTE — Progress Notes (Signed)
Ur chart review completed.  

## 2014-04-21 ENCOUNTER — Encounter (HOSPITAL_COMMUNITY): Payer: Self-pay

## 2014-05-05 ENCOUNTER — Inpatient Hospital Stay (HOSPITAL_COMMUNITY)
Admission: AD | Admit: 2014-05-05 | Discharge: 2014-05-05 | DRG: 779 | Disposition: A | Discharge status: Home / Self Care | Payer: Medicaid Other | Source: Ambulatory Visit | Attending: Obstetrics and Gynecology | Admitting: Obstetrics and Gynecology

## 2014-05-05 ENCOUNTER — Inpatient Hospital Stay (HOSPITAL_COMMUNITY): Payer: Medicaid Other

## 2014-05-05 ENCOUNTER — Encounter (HOSPITAL_COMMUNITY): Payer: Self-pay | Admitting: *Deleted

## 2014-05-05 DIAGNOSIS — Z87891 Personal history of nicotine dependence: Secondary | ICD-10-CM

## 2014-05-05 DIAGNOSIS — O200 Threatened abortion: Secondary | ICD-10-CM

## 2014-05-05 DIAGNOSIS — Z8249 Family history of ischemic heart disease and other diseases of the circulatory system: Secondary | ICD-10-CM

## 2014-05-05 DIAGNOSIS — O99820 Streptococcus B carrier state complicating pregnancy: Secondary | ICD-10-CM | POA: Diagnosis present

## 2014-05-05 DIAGNOSIS — Z833 Family history of diabetes mellitus: Secondary | ICD-10-CM

## 2014-05-05 DIAGNOSIS — O081 Delayed or excessive hemorrhage following ectopic and molar pregnancy: Secondary | ICD-10-CM | POA: Diagnosis present

## 2014-05-05 DIAGNOSIS — IMO0002 Reserved for concepts with insufficient information to code with codable children: Secondary | ICD-10-CM | POA: Diagnosis present

## 2014-05-05 DIAGNOSIS — O021 Missed abortion: Principal | ICD-10-CM | POA: Diagnosis present

## 2014-05-05 DIAGNOSIS — O42912 Preterm premature rupture of membranes, unspecified as to length of time between rupture and onset of labor, second trimester: Secondary | ICD-10-CM

## 2014-05-05 DIAGNOSIS — Z3A14 14 weeks gestation of pregnancy: Secondary | ICD-10-CM | POA: Insufficient documentation

## 2014-05-05 DIAGNOSIS — O26899 Other specified pregnancy related conditions, unspecified trimester: Secondary | ICD-10-CM | POA: Insufficient documentation

## 2014-05-05 DIAGNOSIS — R109 Unspecified abdominal pain: Secondary | ICD-10-CM

## 2014-05-05 LAB — CBC
HEMATOCRIT: 35.4 % — AB (ref 36.0–46.0)
HEMATOCRIT: 30.4 % — AB (ref 36.0–46.0)
HEMOGLOBIN: 12.1 g/dL (ref 12.0–15.0)
HEMOGLOBIN: 10.3 g/dL — AB (ref 12.0–15.0)
MCH: 27.4 pg (ref 26.0–34.0)
MCH: 27.7 pg (ref 26.0–34.0)
MCHC: 34.2 g/dL (ref 30.0–36.0)
MCHC: 33.9 g/dL (ref 30.0–36.0)
MCV: 80.1 fL (ref 78.0–100.0)
MCV: 81.7 fL (ref 78.0–100.0)
Platelets: 268 10*3/uL (ref 150–400)
Platelets: 256 10*3/uL (ref 150–400)
RBC: 4.42 MIL/uL (ref 3.87–5.11)
RBC: 3.72 MIL/uL — AB (ref 3.87–5.11)
RDW: 13.6 % (ref 11.5–15.5)
RDW: 13.7 % (ref 11.5–15.5)
WBC: 9.6 10*3/uL (ref 4.0–10.5)
WBC: 10.7 10*3/uL — AB (ref 4.0–10.5)

## 2014-05-05 LAB — HCG, QUANTITATIVE, PREGNANCY: hCG, Beta Chain, Quant, S: 10157 m[IU]/mL — ABNORMAL HIGH (ref <5)

## 2014-05-05 LAB — ABO/RH: ABO/RH(D): O POS

## 2014-05-05 MED ORDER — LACTATED RINGERS IV SOLN
INTRAVENOUS | Status: DC
  Administered 2014-05-05: 11:00:00 via INTRAVENOUS

## 2014-05-05 MED ORDER — OXYCODONE-ACETAMINOPHEN 5-325 MG PO TABS: 1.0000 | ORAL_TABLET | ORAL | Status: DC | PRN

## 2014-05-05 MED ORDER — ACETAMINOPHEN 325 MG PO TABS: 650.0000 mg | ORAL_TABLET | ORAL | Status: DC | PRN

## 2014-05-05 MED ORDER — OXYCODONE-ACETAMINOPHEN 5-325 MG PO TABS: 2.0000 | ORAL_TABLET | ORAL | Status: DC | PRN

## 2014-05-05 MED ORDER — LACTATED RINGERS IV SOLN: INTRAVENOUS | Status: DC

## 2014-05-05 MED ORDER — LIDOCAINE HCL (PF) 1 % IJ SOLN: 30.0000 mL | INTRAMUSCULAR | Status: DC | PRN

## 2014-05-05 MED ORDER — MISOPROSTOL 200 MCG PO TABS
200.0000 ug | ORAL_TABLET | ORAL | Status: DC
  Administered 2014-05-05: 200 ug via ORAL
  Filled 2014-05-05: qty 1

## 2014-05-05 MED ORDER — ONDANSETRON HCL 4 MG PO TABS: 4.0000 mg | ORAL_TABLET | ORAL | Status: DC | PRN

## 2014-05-05 MED ORDER — ZOLPIDEM TARTRATE 5 MG PO TABS: 5.0000 mg | ORAL_TABLET | Freq: Every evening | ORAL | Status: DC | PRN

## 2014-05-05 MED ORDER — PRENATAL MULTIVITAMIN CH: 1.0000 | ORAL_TABLET | Freq: Every day | ORAL | Status: DC

## 2014-05-05 MED ORDER — HYDROMORPHONE HCL 1 MG/ML IJ SOLN: 1.0000 mg | INTRAMUSCULAR | Status: DC | PRN

## 2014-05-05 MED ORDER — IBUPROFEN 600 MG PO TABS
600.0000 mg | ORAL_TABLET | Freq: Four times a day (QID) | ORAL | Status: DC
  Administered 2014-05-05: 600 mg via ORAL
  Filled 2014-05-05: qty 1

## 2014-05-05 MED ORDER — HYDROMORPHONE HCL 1 MG/ML IJ SOLN
1.0000 mg | Freq: Once | INTRAMUSCULAR | Status: AC
  Administered 2014-05-05: 1 mg via INTRAVENOUS
  Filled 2014-05-05: qty 1

## 2014-05-05 MED ORDER — MISOPROSTOL 200 MCG PO TABS
400.0000 ug | ORAL_TABLET | Freq: Once | ORAL | Status: AC
  Administered 2014-05-05: 400 ug via ORAL
  Filled 2014-05-05: qty 2

## 2014-05-05 MED ORDER — ACETAMINOPHEN 325 MG PO TABS: 650.0000 mg | ORAL_TABLET | Freq: Four times a day (QID) | ORAL | Status: DC | PRN

## 2014-05-05 MED ORDER — WITCH HAZEL-GLYCERIN EX PADS: 1.0000 "application " | MEDICATED_PAD | CUTANEOUS | Status: DC | PRN

## 2014-05-05 MED ORDER — TERBUTALINE SULFATE 1 MG/ML IJ SOLN: 0.2500 mg | Freq: Once | INTRAMUSCULAR | Status: DC | PRN

## 2014-05-05 MED ORDER — ONDANSETRON HCL 4 MG/2ML IJ SOLN: 4.0000 mg | Freq: Four times a day (QID) | INTRAMUSCULAR | Status: DC | PRN

## 2014-05-05 MED ORDER — TETANUS-DIPHTH-ACELL PERTUSSIS 5-2.5-18.5 LF-MCG/0.5 IM SUSP
0.5000 mL | Freq: Once | INTRAMUSCULAR | Status: DC
  Filled 2014-05-05: qty 0.5

## 2014-05-05 MED ORDER — OXYTOCIN 40 UNITS IN LACTATED RINGERS INFUSION - SIMPLE MED
62.5000 mL/h | INTRAVENOUS | Status: DC
  Filled 2014-05-05: qty 1000

## 2014-05-05 MED ORDER — IBUPROFEN 600 MG PO TABS: 600.0000 mg | ORAL_TABLET | Freq: Four times a day (QID) | ORAL | Status: DC | PRN

## 2014-05-05 MED ORDER — BENZOCAINE-MENTHOL 20-0.5 % EX AERO
1.0000 "application " | INHALATION_SPRAY | CUTANEOUS | Status: DC | PRN
  Filled 2014-05-05: qty 56

## 2014-05-05 MED ORDER — DIPHENHYDRAMINE HCL 25 MG PO CAPS: 25.0000 mg | ORAL_CAPSULE | Freq: Four times a day (QID) | ORAL | Status: DC | PRN

## 2014-05-05 MED ORDER — LACTATED RINGERS IV SOLN: 500.0000 mL | INTRAVENOUS | Status: DC | PRN

## 2014-05-05 MED ORDER — OXYTOCIN BOLUS FROM INFUSION
500.0000 mL | INTRAVENOUS | Status: DC
  Administered 2014-05-05: 500 mL via INTRAVENOUS

## 2014-05-05 MED ORDER — OXYTOCIN 40 UNITS IN LACTATED RINGERS INFUSION - SIMPLE MED: 62.5000 mL/h | INTRAVENOUS | Status: DC

## 2014-05-05 MED ORDER — LANOLIN HYDROUS EX OINT: TOPICAL_OINTMENT | CUTANEOUS | Status: DC | PRN

## 2014-05-05 MED ORDER — ACETAMINOPHEN 325 MG PO TABS: 325.0000 mg | ORAL_TABLET | Freq: Four times a day (QID) | ORAL | Status: DC | PRN

## 2014-05-05 MED ORDER — LORAZEPAM 2 MG/ML IJ SOLN
1.0000 mg | INTRAMUSCULAR | Status: DC | PRN
  Administered 2014-05-05: 1 mg via INTRAVENOUS
  Filled 2014-05-05 (×2): qty 0.5

## 2014-05-05 MED ORDER — LORAZEPAM 2 MG/ML IJ SOLN: 1.0000 mg | INTRAMUSCULAR | Status: DC | PRN

## 2014-05-05 MED ORDER — CITRIC ACID-SODIUM CITRATE 334-500 MG/5ML PO SOLN: 30.0000 mL | ORAL | Status: DC | PRN

## 2014-05-05 MED ORDER — SENNOSIDES-DOCUSATE SODIUM 8.6-50 MG PO TABS: 2.0000 | ORAL_TABLET | ORAL | Status: DC

## 2014-05-05 MED ORDER — IBUPROFEN 600 MG PO TABS: 600.0000 mg | ORAL_TABLET | Freq: Four times a day (QID) | ORAL | Status: DC

## 2014-05-05 MED ORDER — SIMETHICONE 80 MG PO CHEW
80.0000 mg | CHEWABLE_TABLET | ORAL | Status: DC | PRN
Start: 2014-05-05 — End: 2014-05-05

## 2014-05-05 MED ORDER — DIBUCAINE 1 % RE OINT
1.0000 "application " | TOPICAL_OINTMENT | RECTAL | Status: DC | PRN
  Filled 2014-05-05: qty 28

## 2014-05-05 MED ORDER — OXYTOCIN BOLUS FROM INFUSION: 500.0000 mL | INTRAVENOUS | Status: DC

## 2014-05-05 MED ORDER — ONDANSETRON HCL 4 MG/2ML IJ SOLN
4.0000 mg | INTRAMUSCULAR | Status: DC | PRN
Start: 2014-05-05 — End: 2014-05-05

## 2014-05-05 MED ORDER — INFLUENZA VAC SPLIT QUAD 0.5 ML IM SUSY
0.5000 mL | PREFILLED_SYRINGE | INTRAMUSCULAR | Status: AC
  Administered 2014-05-05: 0.5 mL via INTRAMUSCULAR
  Filled 2014-05-05: qty 0.5

## 2014-05-05 NOTE — Progress Notes (Signed)
Pt called out stating she needed to go to bathroom to have bowel movement. Patient also stated she felt dizzy. Encouraged to use bedpan. Patient had large bowel movement and what looked like placental delivery. Dr Su Hiltoberts notified to come assess.

## 2014-05-05 NOTE — MAU Note (Signed)
Deliver June 19, bled following delivery but no period. +HPT Sep 29.  This morning at work started feeling pressure like the baby was going to come out. Hx of PTL and incompetent cervix.  Had cerclage previously, had not called MD about care.  Waiting on medicaid.  Denies bleeding.

## 2014-05-05 NOTE — MAU Provider Note (Signed)
History     CSN: [id number]  Arrival date and time: 05/05/14 [id number]   First Provider Initiated Contact with Patient 05/05/14 [phone]      Chief Complaint  Patient presents with  . Threatened Miscarriage   HPI  [name] is a 28 y.o. female [phone] at 7332w6d who presents with vaginal pressure, denies vaginal bleeding. She is unsure of her LMP; she had a baby in June (June 19 with cerclage placed for incompetent cervix)  At 6 weeks she had unprotected sex. [facility]   She had a small amount of bleeding in July, however never had a menstrual cycle. She complains of mild, lower abdominal pain. Denies bleeding. She has not started prenatal care because she does not have health insurance.     OB History    Gravida Para Term Preterm AB TAB SAB Ectopic Multiple Living   6 3 3  3  3   3       Past Medical History  Diagnosis Date  . 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  . Preterm labor     Past Surgical History  Procedure Laterality Date  . Cholecystectomy  2008  . Cervical cerclage    . Cervical cerclage  03/30/2012    Procedure: CERCLAGE CERVICAL;  Surgeon: Sherron MondayJody Bovard, MD;  Location: [facility];  Service: Gynecology;  Laterality: N/A;  . Dilation and evacuation  04/07/2012    Procedure: DILATATION AND EVACUATION;  Surgeon: Oliver PilaKathy W Richardson, MD;  Location: [facility];  Service: Gynecology;  Laterality: N/A;  . Cervical cerclage N/A 08/19/2013    Procedure: CERCLAGE CERVICAL;  Surgeon: Sherron MondayJody Bovard, MD;  Location: [facility];  Service: Gynecology;  Laterality: N/A;  . Dilation and curettage of uterus      Family History  Problem Relation Age of Onset  . Diabetes Mother   . Hypertension Mother   . Kidney disease Mother   . Heart disease Mother     History  Substance Use Topics  . Smoking status: Former Smoker -- 0.25 packs/day for 2 years    Types: Cigarettes    Quit date:  08/15/2010  . Smokeless tobacco: Never Used  . Alcohol Use: No    Allergies: No Known Allergies  Prescriptions prior to admission  Medication Sig Dispense Refill Last Dose  . acetaminophen (TYLENOL) 325 MG tablet Take 325 mg by mouth every 6 (six) hours as needed for headache.   12/05/2013 at Unknown time  . ibuprofen (ADVIL,MOTRIN) 600 MG tablet Take 1 tablet (600 mg total) by mouth every 6 (six) hours. 30 tablet 0    Results for orders placed or performed during the hospital encounter of 05/05/14 (from the past 48 hour(s))  CBC     Status: Abnormal   Collection Time: 05/05/14  9:55 AM  Result Value Ref Range   WBC 9.6 4.0 - 10.5 K/uL   RBC 4.42 3.87 - 5.11 MIL/uL   Hemoglobin 12.1 12.0 - 15.0 g/dL   HCT 21.335.4 (L) 08.636.0 - 57.846.0 %   MCV 80.1 78.0 - 100.0 fL   MCH 27.4 26.0 - 34.0 pg   MCHC 34.2 30.0 - 36.0 g/dL   RDW 46.913.6 62.911.5 - 52.815.5 %   Platelets 268 150 - 400 K/uL  hCG, quantitative, pregnancy     Status: Abnormal   Collection Time: 05/05/14  9:55 AM  Result Value Ref Range   hCG, Beta Chain,  Quant, S 10157 (H) <5 mIU/mL    Comment:          GEST. AGE      CONC.  (mIU/mL)   <=1 WEEK        5 - 50     2 WEEKS       50 - 500     3 WEEKS       100 - 10,000     4 WEEKS     1,000 - 30,000     5 WEEKS     3,500 - 115,000   6-8 WEEKS     12,000 - 270,000    12 WEEKS     15,000 - 220,000        FEMALE AND NON-PREGNANT FEMALE:     LESS THAN 5 mIU/mL     Review of Systems  Constitutional: Negative for fever and chills.  Gastrointestinal: Positive for abdominal pain (+abdominal pressure ).  Genitourinary:       Denies vaginal bleeding    Physical Exam   Blood pressure 148/70, pulse 117, temperature 97.4 F (36.3 C), temperature source Oral, resp. rate 18, unknown if currently breastfeeding.  Physical Exam  Constitutional: She is oriented to person, place, and time. She appears well-developed and well-nourished. No distress.  HENT:  Head: Normocephalic.  Eyes: Pupils are  equal, round, and reactive to light.  Neck: Neck supple.  Respiratory: Effort normal.  GI: Soft.  Genitourinary:  Speculum exam: Vagina - Moderate amount of creamy/clear fluid pooling in the vaginal canal  Cervix - membranes hour-glassing in the vagina.  Bimanual exam: Deferred  Chaperone present for exam.   Neurological: She is alert and oriented to person, place, and time.  Skin: She is not diaphoretic.  Psychiatric: Her behavior is normal.  negative fern slide per RN.   MAU Course  Procedures  None  MDM Unable to doppler fetal heart tones Bedside US  O positive blood type  CBC Patient to go to labor and delivery; ROM at 1045; moderate amount of bloody fluid  Report called to charge nurse on L/D; RN in patients room starting IV, preparing for transfer; delivery is imminent   Assessment and Plan   A: Fetal demise Preterm labor PROM   P: Admit to Labor and delivery per Dr. Phil Dopponstant   Jennifer Irene Rasch, NP 05/05/2014 11:02 AM

## 2014-05-05 NOTE — Progress Notes (Signed)
   05/05/14 1700  Clinical Encounter Type  Visited With Patient  Visit Type Spiritual support;Social support (IUFD/bereavement)  Referral From Nurse  Spiritual Encounters  Spiritual Needs Grief support;Emotional;Brochure (Comfort materials, prayer shawl)  Stress Factors  Patient Stress Factors Loss (complicated, grief-filled social history)   Spent the entire afternoon (6+ hours) with Ammarie, providing emotional and spiritual support.  She has limited support, stating that her friends can be judgmental and not able to meet her emotionally.  Also very complicated social history: her mom died when pt was [age]; pt was in uncle's custody thereafter; hx addiction in adults in her family; pt has three children and her 26 year old half sister in her home.  Linus MakoVontessa is reflective, self-aware, and articulates hopes for future for her children and for herself (wants to learn medical billing/coding).  I supported her through cramping, delivery, deciding to see and hold her baby, discernment about disposition of baby, and initial processing afterward.  Provided pastoral presence, reflective listening, affirmation, encouragement, bereavement support, review of Comfort materials, and prayer of blessing.   Linus MakoVontessa is currently discerning whether she would like hospital to take care of baby or whether she would like to have him cremated.  Consulted with Tia AlertPaige Grady, RN, covering house, re pt's desire to notify hospital of her decision tomorrow (after she can drive by the funeral home that cremated her daughter so that she can recall its name).  AC aware and approved.  Pt has house coverage number to notify and plans to call tomorrow.  Pt verbalized deep gratitude multiple times.  [address]Chaplain Edison Wollschlager [place]unde[place], [place] [phone]

## 2014-05-05 NOTE — H&P (Signed)
Vic BlackbirdVontessa Axley is a 28 y.o. female presenting for IUFD.  Patient was at work this AM, when she started to feel vaginal pressure around 0830.  She went to the restroom and felt fluid leakage and vaginal bleeding.  She has not been able to establish for prenatal care yet, as her Medicaid is pending.  In the MAU, ultrasound showed no fetal heart beat, and patient with ROM after US.  Patient reports previous cervical incompetence with cerclage for last 3 pregnancies and h/o 3 SABs.   History OB History    Gravida Para Term Preterm AB TAB SAB Ectopic Multiple Living   7 3 3  3  3   3      Past Medical History  Diagnosis Date  . 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  . Preterm labor    Past Surgical History  Procedure Laterality Date  . Cholecystectomy  2008  . Cervical cerclage    . Cervical cerclage  03/30/2012    Procedure: CERCLAGE CERVICAL;  Surgeon: Sherron MondayJody Bovard, MD;  Location: [facility];  Service: Gynecology;  Laterality: N/A;  . Dilation and evacuation  04/07/2012    Procedure: DILATATION AND EVACUATION;  Surgeon: Oliver PilaKathy W Richardson, MD;  Location: [facility];  Service: Gynecology;  Laterality: N/A;  . Cervical cerclage N/A 08/19/2013    Procedure: CERCLAGE CERVICAL;  Surgeon: Sherron MondayJody Bovard, MD;  Location: [facility];  Service: Gynecology;  Laterality: N/A;  . Dilation and curettage of uterus     Family History: family history includes Diabetes in her mother; Heart disease in her mother; Hypertension in her mother; Kidney disease in her mother. Social History:  reports that she quit smoking about 3 years ago. Her smoking use included Cigarettes. She has a .5 pack-year smoking history. She has never used smokeless tobacco. She reports that she does not drink alcohol or use illicit drugs.  Review of Systems  Constitutional: Negative for fever and chills.  Gastrointestinal: Negative for vomiting.      Blood pressure 128/60, pulse 115, temperature 98.3 F  (36.8 C), temperature source Oral, resp. rate 18, unknown if currently breastfeeding. Maternal Exam:  Introitus: Amniotic fluid character: not assessed.  Pelvis: adequate for delivery.   Cervix: Cervix evaluated by digital exam.     Physical Exam  Prenatal labs: ABO, Rh: --/--/O POS (11/16 0955) Antibody: NEG (06/19 0840) Rubella: Immune (06/19 0000) RPR: NON REAC (06/19 0840)  HBsAg: Negative (06/19 0000)  HIV: Non-reactive (06/19 0000)  GBS: Positive (06/19 0000)   Assessment/Plan: Induction of labor for IUFD   Shirlee LatchBacigalupo, Angela 05/05/2014, 11:37 AM

## 2014-05-05 NOTE — MAU Note (Signed)
SROM while starting IV.  J Rasch NP at bedside. Clear fluid noted, blood show.  Dr Jolayne Pantheronstant to meet pt in L&D to discuss prognosis and plan

## 2014-05-05 NOTE — Discharge Summary (Signed)
. Obstetric Discharge Summary Reason for Admission: rupture of membranes Prenatal Procedures: ultrasound with IUFD Intrapartum Procedures: spontaneous vaginal delivery Postpartum Procedures: none Complications-Operative and Postpartum: hemorrhage  Delivery Note  PRE-OPERATIVE DIAGNOSIS:  1) 3763w6d pregnancy.   POST-OPERATIVE DIAGNOSIS:  1) 5463w6d pregnancy s/p Vaginal, Spontaneous Delivery ; IUFD  Delivery Type: Vaginal, Spontaneous Delivery   Delivery Clinician: Fredirick [name], [name]   Delivery Anesthesia: None   Labor Complications:  IUFD, post partum hemorrhage  Lacerations: None   ESTIMATED BLOOD LOSS: 900    Labor course: This is a 28 y.o. y.o. female [phone] who came in at 7263w6d pregnancy complaining of pressure in her vagina. Her prenatal course was complicated by h/o cervical incompetence with cerclage for last 3 pregnancies and h/o 3 SABs. Patient was at work this AM, when she started to feel vaginal pressure around 0830. She went to the restroom and felt fluid leakage and vaginal bleeding. She has not been able to establish for prenatal care yet, as her Medicaid is pending. In the MAU, ultrasound showed no fetal heart beat, and patient with ROM after US. She was admitted to L and D. Labor course included:  Spontaneous delivery of fetus   Procedure: Vaginal, Spontaneous Delivery   Date of birth: 05/05/2012  Time of birth: 12:37 PM   This [id number] woman under IV anesthesia delivered a non-viable fetus. Delivery was via NSVD.   Delivery completed and cord cut.  Intact placenta delivered spontaneously at 11/16 3:08 PM . No vaginal lacerations noted. Uterus well contracted at end of delivery. Mother tolerated delivery well and chaplain support provided.   FINDINGS:  1) Female infant, Apgar scores of 0  at 1 minute 0  at 5 minutes    SPECIMENS: Placenta to pathology   COMPLICATIONS: Post Partum hemorrhage  DISPOSITION:  Infant to cremation per  maternal request    Hospital Course:  Active Problems:   Fetal demise   Abdominal pain in pregnancy   [redacted] weeks gestation of pregnancy   IUFD (intrauterine fetal death)   Today: No acute events.  Pt denies problems with ambulating, voiding or po intake.  She denies nausea or vomiting.  Pain is moderately controlled.  She has had flatus. She has had bowel movement.  Lochia Minimal.  Plan for birth control is  IUD.    [name] is a 28 y.o. [id number] s/p NSVD of IUFD at 14 wks.  Patient presented to  Patient was at work this AM, when she started to feel vaginal pressure around 0830. She went to the restroom and felt fluid leakage and vaginal bleeding. She has not been able to establish for prenatal care yet, as her Medicaid is pending. In the MAU, ultrasound showed no fetal heart beat, and patient with ROM after US and was admitted to L&D.  She has postpartum course that was uncomplicated including no problems with ambulating, PO intake, urination, pain, or bleeding. The pt feels ready to go home and  will be discharged with outpatient follow-up.    H/H: Lab Results  Component Value Date/Time   HGB 10.3* 05/05/2014 04:48 PM   HCT 30.4* 05/05/2014 04:48 PM    Discharge Diagnoses: IUFD  Discharge Information: Date: 05/05/2014 Activity: pelvic rest Diet: routine  Medications: Ibuprofen Condition: stable Instructions: refer to handout Discharge to: home      Medication List    ASK your doctor about these medications        acetaminophen 325 MG tablet  Commonly known as:  TYLENOL  Take 325 mg by mouth every 6 (six) hours as needed for headache.     ibuprofen 600 MG tablet  Commonly known as:  ADVIL,MOTRIN  Take 1 tablet (600 mg total) by mouth every 6 (six) hours.         [name], [name] ,MD OB Fellow 05/05/2014,6:17 PM

## 2014-05-05 NOTE — MAU Note (Signed)
Bedside US complicated

## 2014-05-05 NOTE — Progress Notes (Addendum)
Patient discharged to home with friend. Taken to car via wheelchair. Before leaving room patient stated she would like for hospital to dispose of infant. Informed patient that if hospital disposed of baby, ashes would not be available to pick up afterwards. Patient stated she understood and still wanted hospital to dispose. Patient not given TDAP vaccine since she received it in June 2015. Ok not to give again per Dr Su Hiltoberts. Letter of absence given to patient for work regarding admission to hospital.

## 2014-06-02 ENCOUNTER — Encounter: Payer: Self-pay | Admitting: *Deleted

## 2014-06-02 ENCOUNTER — Encounter: Payer: Medicaid Other | Admitting: Obstetrics & Gynecology

## 2014-06-02 ENCOUNTER — Telehealth: Payer: Self-pay | Admitting: *Deleted

## 2014-06-02 NOTE — Telephone Encounter (Signed)
[name] missed a scheduled appointment today for follow up after a SAB. Called and left a message notifying her she missed an appointment, and to call our office to reschedule. Will also send letter.

## 2015-05-04 ENCOUNTER — Encounter: Payer: Self-pay | Admitting: Obstetrics & Gynecology

## 2015-05-04 ENCOUNTER — Ambulatory Visit (INDEPENDENT_AMBULATORY_CARE_PROVIDER_SITE_OTHER): Payer: Medicaid Other

## 2015-05-04 LAB — POCT PREGNANCY, URINE: PREG TEST UR: POSITIVE — AB

## 2015-05-28 ENCOUNTER — Encounter: Payer: Medicaid Other | Admitting: Obstetrics & Gynecology

## 2015-06-09 ENCOUNTER — Encounter (HOSPITAL_COMMUNITY): Payer: Self-pay

## 2015-06-16 NOTE — H&P (Signed)
[name] is a 29 y.o. female [phone] at 12+ with history of incompetent cervix with 15 and 17 week loss.  Has had successful term deliveries after cerclage placement.  D/W pt prophylactic cerclage and placement, inc r/b/a.  Wishes to proceed ASAP as recent 15 wk loss.  D/W pt possibility of fetal malformation - unable to be detected by early US.    Maternal Medical History:  Prenatal Complications - Diabetes: none.    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  . Preterm labor   . Headache    Past Surgical History  Procedure Laterality Date  . Cholecystectomy  2008  . Cervical cerclage    . Cervical cerclage  03/30/2012    Procedure: CERCLAGE CERVICAL;  Surgeon: Sherron MondayJody Bovard, MD;  Location: [facility];  Service: Gynecology;  Laterality: N/A;  . Dilation and evacuation  04/07/2012    Procedure: DILATATION AND EVACUATION;  Surgeon: Oliver PilaKathy W Richardson, MD;  Location: [facility];  Service: Gynecology;  Laterality: N/A;  . Cervical cerclage N/A 08/19/2013    Procedure: CERCLAGE CERVICAL;  Surgeon: Sherron MondayJody Bovard, MD;  Location: [facility];  Service: Gynecology;  Laterality: N/A;  . Dilation and curettage of uterus     Family History: family history includes Diabetes in her mother; Heart disease in her mother; Hypertension in her mother; Kidney disease in her mother.Breast cancer, CAD, renal failure Social History:  reports that she quit smoking about 4 years ago. Her smoking use included Cigarettes. She has a .5 pack-year smoking history. She has never used smokeless tobacco. She  reports that she does not drink alcohol or use illicit drugs.single, [employer] [employer]  Meds Diclegis, PNV All NKDA   Prenatal Transfer Tool  Maternal Diabetes: No Genetic Screening: Normal Maternal Ultrasounds/Referrals: Normal Fetal Ultrasounds or other Referrals:  None Maternal Substance Abuse:  Yes:  Type: Smoker h/o Significant Maternal Medications:  Meds include: Other: Diclegis Significant Maternal Lab Results:  None Other Comments:  h/o cerclage x 3  Review of Systems  Constitutional: Negative.   HENT: Negative.   Eyes: Negative.   Respiratory: Negative.   Cardiovascular: Negative.   Gastrointestinal: Negative.   Genitourinary: Negative.   Musculoskeletal: Negative.   Skin: Negative.   Neurological: Negative.   Psychiatric/Behavioral: Negative.       unknown if currently breastfeeding. Maternal Exam:  Abdomen: Patient reports no abdominal tenderness. Introitus: Normal vulva. Normal vagina.  Cervix: Cervix evaluated by digital exam.     Physical Exam  Constitutional: She is oriented to person, place, and time. She appears well-developed and well-nourished.  HENT:  Head: Normocephalic and atraumatic.  Cardiovascular: Normal rate and regular rhythm.   Respiratory: Effort normal and breath sounds normal. No respiratory distress. She has no wheezes.  GI: Soft. Bowel sounds are normal. She exhibits no distension. There is no tenderness.  Musculoskeletal: Normal range of motion.  Neurological: She is alert and oriented to person, place, and time.  Skin: Skin is warm and dry.  Psychiatric: She has a normal mood and affect. Her behavior is normal.    Prenatal labs: ABO, Rh:  O+ Antibody:  neg Rubella:  immune RPR:   NR HBsAg:   neg HIV:   neg GBS:   not assessed  Varicella immune/Pap WNL/CF neg/Ur Cx neg/GC neg/Chl neg/Hgb 11.7/Plt 265  US cwd, [facility] 12/26/15  Assessment/Plan: 29yo [id number] at 12+ for Mcdonald cervical cerclage D/w pt r/b/a of  cervical cerclage Will attempt to place 2 sutures    [name], [name] 06/16/2015, 9:14 PM

## 2015-06-17 ENCOUNTER — Ambulatory Visit (HOSPITAL_COMMUNITY)
Admission: RE | Admit: 2015-06-17 | Discharge: 2015-06-17 | Disposition: A | Discharge status: Home / Self Care | Payer: Medicaid Other | Source: Ambulatory Visit | Attending: Obstetrics and Gynecology | Admitting: Obstetrics and Gynecology

## 2015-06-17 ENCOUNTER — Encounter (HOSPITAL_COMMUNITY): Payer: Self-pay | Admitting: Emergency Medicine

## 2015-06-17 ENCOUNTER — Encounter (HOSPITAL_COMMUNITY)
Admission: RE | Disposition: A | Discharge status: Home / Self Care | Payer: Self-pay | Source: Ambulatory Visit | Attending: Obstetrics and Gynecology

## 2015-06-17 ENCOUNTER — Ambulatory Visit (HOSPITAL_COMMUNITY): Payer: Medicaid Other | Admitting: Anesthesiology

## 2015-06-17 DIAGNOSIS — F17200 Nicotine dependence, unspecified, uncomplicated: Secondary | ICD-10-CM | POA: Insufficient documentation

## 2015-06-17 DIAGNOSIS — O3431 Maternal care for cervical incompetence, first trimester: Secondary | ICD-10-CM | POA: Diagnosis not present

## 2015-06-17 HISTORY — DX: Headache, unspecified: R51.9

## 2015-06-17 HISTORY — PX: CERVICAL CERCLAGE: SHX1329

## 2015-06-17 HISTORY — DX: Headache: R51

## 2015-06-17 LAB — CBC
HCT: 34.1 % — ABNORMAL LOW (ref 36.0–46.0)
Hemoglobin: 11.5 g/dL — ABNORMAL LOW (ref 12.0–15.0)
MCH: 27.8 pg (ref 26.0–34.0)
MCHC: 33.7 g/dL (ref 30.0–36.0)
MCV: 82.6 fL (ref 78.0–100.0)
PLATELETS: 242 10*3/uL (ref 150–400)
RBC: 4.13 MIL/uL (ref 3.87–5.11)
RDW: 14.1 % (ref 11.5–15.5)
WBC: 8.2 10*3/uL (ref 4.0–10.5)

## 2015-06-17 SURGERY — CERCLAGE, CERVIX, VAGINAL APPROACH
Anesthesia: Spinal | Site: Vagina

## 2015-06-17 MED ORDER — FENTANYL CITRATE (PF) 100 MCG/2ML IJ SOLN
INTRAMUSCULAR | Status: DC | PRN
  Administered 2015-06-17 (×4): 25 ug via INTRAVENOUS

## 2015-06-17 MED ORDER — FENTANYL CITRATE (PF) 100 MCG/2ML IJ SOLN
INTRAMUSCULAR | Status: AC
  Filled 2015-06-17: qty 2

## 2015-06-17 MED ORDER — PHENYLEPHRINE HCL 10 MG/ML IJ SOLN
INTRAMUSCULAR | Status: DC | PRN
  Administered 2015-06-17: 40 ug via INTRAVENOUS

## 2015-06-17 MED ORDER — HYDROCODONE-ACETAMINOPHEN 7.5-325 MG PO TABS: 1.0000 | ORAL_TABLET | Freq: Once | ORAL | Status: DC | PRN

## 2015-06-17 MED ORDER — FENTANYL CITRATE (PF) 100 MCG/2ML IJ SOLN: 25.0000 ug | INTRAMUSCULAR | Status: DC | PRN

## 2015-06-17 MED ORDER — PHENYLEPHRINE 40 MCG/ML (10ML) SYRINGE FOR IV PUSH (FOR BLOOD PRESSURE SUPPORT)
PREFILLED_SYRINGE | INTRAVENOUS | Status: AC
  Filled 2015-06-17: qty 10

## 2015-06-17 MED ORDER — MEPERIDINE HCL 25 MG/ML IJ SOLN: 6.2500 mg | INTRAMUSCULAR | Status: DC | PRN

## 2015-06-17 MED ORDER — BUPIVACAINE IN DEXTROSE 0.75-8.25 % IT SOLN
INTRATHECAL | Status: DC | PRN
  Administered 2015-06-17: 1.2 mL via INTRATHECAL

## 2015-06-17 MED ORDER — OXYCODONE-ACETAMINOPHEN 5-325 MG PO TABS: 1.0000 | ORAL_TABLET | Freq: Four times a day (QID) | ORAL | Status: DC | PRN

## 2015-06-17 MED ORDER — METOCLOPRAMIDE HCL 5 MG/ML IJ SOLN: 10.0000 mg | Freq: Once | INTRAMUSCULAR | Status: DC | PRN

## 2015-06-17 MED ORDER — LACTATED RINGERS IV SOLN
INTRAVENOUS | Status: DC
  Administered 2015-06-17 (×3): via INTRAVENOUS

## 2015-06-17 SURGICAL SUPPLY — 19 items
CANISTER SUCT 3000ML (MISCELLANEOUS) ×2 IMPLANT
CLOTH BEACON ORANGE TIMEOUT ST (SAFETY) ×2 IMPLANT
COUNTER NEEDLE 1200 MAGNETIC (NEEDLE) IMPLANT
GLOVE BIO SURGEON STRL SZ 6.5 (GLOVE) ×2 IMPLANT
GLOVE BIOGEL PI IND STRL 7.0 (GLOVE) ×1 IMPLANT
GLOVE BIOGEL PI INDICATOR 7.0 (GLOVE) ×1
GOWN STRL REUS W/TWL LRG LVL3 (GOWN DISPOSABLE) ×4 IMPLANT
NEEDLE MAYO CATGUT SZ4 (NEEDLE) ×2 IMPLANT
NS IRRIG 1000ML POUR BTL (IV SOLUTION) ×2 IMPLANT
PACK VAGINAL MINOR WOMEN LF (CUSTOM PROCEDURE TRAY) ×2 IMPLANT
PAD OB MATERNITY 4.3X12.25 (PERSONAL CARE ITEMS) ×2 IMPLANT
PAD PREP 24X48 CUFFED NSTRL (MISCELLANEOUS) ×2 IMPLANT
SUT PROLENE 1 CTX 30  8455H (SUTURE) ×2
SUT PROLENE 1 CTX 30 8455H (SUTURE) ×2 IMPLANT
TOWEL OR 17X24 6PK STRL BLUE (TOWEL DISPOSABLE) ×4 IMPLANT
TRAY FOLEY CATH SILVER 14FR (SET/KITS/TRAYS/PACK) ×2 IMPLANT
TUBING NON-CON 1/4 X 20 CONN (TUBING) IMPLANT
WATER STERILE IRR 1000ML POUR (IV SOLUTION) IMPLANT
YANKAUER SUCT BULB TIP NO VENT (SUCTIONS) IMPLANT

## 2015-06-17 NOTE — Transfer of Care (Signed)
Immediate Anesthesia Transfer of Care Note  Patient: [name]  Procedure(s) Performed: Procedure(s): CERCLAGE CERVICAL (N/A)  Patient Location: PACU  Anesthesia Type:Spinal  Level of Consciousness: awake, alert , oriented and patient cooperative  Airway & Oxygen Therapy: Patient Spontanous Breathing  Post-op Assessment: Report given to RN and Post -op Vital signs reviewed and stable  Post vital signs: Reviewed and stable  Last Vitals:  Filed Vitals:   06/17/15 0821  BP: 126/65  Pulse: 90  Temp: 36.9 C  Resp: 16    Complications: No apparent anesthesia complications

## 2015-06-17 NOTE — Op Note (Signed)
[name]:  [name], [name]         ACCOUNT NO.:  [id number]  MEDICAL RECORD NO.:  [id number]  LOCATION:  [facility]                          [facility]  PHYSICIAN:  Sherron MondayJody Bovard, MD        DATE OF BIRTH:  1986-01-18  DATE OF PROCEDURE:  06/17/2015 DATE OF DISCHARGE:                              OPERATIVE REPORT   PREOPERATIVE DIAGNOSIS:  History of incompetent cervix.  POSTOPERATIVE DIAGNOSES:  History of incompetent cervix, status post cerclage.  PROCEDURE:  McDonald cervical cerclage with 2 sutures tied at 12 o'clock.  SURGEON:  Sherron MondayJody Bovard, MD  ANESTHESIA:  Spinal.  IV FLUIDS:  1800 mL.  URINE OUTPUT:  150 mL clear urine at the end of the procedure.  EBL:  Approximately 5 mL.  COMPLICATIONS:  None.  PATHOLOGY:  None.  DESCRIPTION OF PROCEDURE:  After informed consent was reviewed with the patient including risks, benefits, and alternatives of surgical procedure, she was transported to the OR after spinal anesthesia was placed and found to be adequate.  She was placed in the Yellofins stirrups, prepped and draped in the normal sterile fashion.  Using an open-sided Graves speculum, her cervix was easily visualized and a McDonald cerclage was placed with suture from 1 to 11, 10 to 8, 7 to 5, 5 to 2, 2 back to midnight, 2 sutures were placed approximately giving her a cervical length of 1.5 to 2 cm.  The first suture was placed slightly higher or more superior than the first suture.  The cervix was noted to be hemostatic.  The patient was given bleeding and pain precautions.  She was discharged to the PACU in stable condition. Sponge, lap, and needle counts were correct x2 per the operating room staff.     Sherron MondayJody Bovard, MD    JB/MEDQ  D:  06/17/2015  T:  06/17/2015  Job:  [id number]

## 2015-06-17 NOTE — Interval H&P Note (Signed)
History and Physical Interval Note:  06/17/2015 12:02 PM  [name]  has presented today for surgery, with the diagnosis of h/o incompetent cervix  The various methods of treatment have been discussed with the patient and family. After consideration of risks, benefits and other options for treatment, the patient has consented to  Procedure(s): CERCLAGE CERVICAL (N/A) as a surgical intervention .  The patient's history has been reviewed, patient examined, no change in status, stable for surgery.  I have reviewed the patient's chart and labs.  Questions were answered to the patient's satisfaction.     [name], Adaria Hole

## 2015-06-17 NOTE — Anesthesia Procedure Notes (Signed)
Spinal Patient location during procedure: OR Start time: 06/17/2015 12:43 PM Staffing Anesthesiologist: Mal AmabileFOSTER, Gwynne Kemnitz Preanesthetic Checklist Completed: patient identified, site marked, surgical consent, pre-op evaluation, timeout performed, IV checked, risks and benefits discussed and monitors and equipment checked Spinal Block Patient position: sitting Prep: site prepped and draped and DuraPrep Patient monitoring: heart rate, cardiac monitor, continuous pulse ox and blood pressure Approach: midline Location: L4-5 Injection technique: single-shot Needle Needle type: Sprotte  Needle gauge: 24 G Needle length: 9 cm Needle insertion depth: 5 cm Assessment Sensory level: T8 Additional Notes Patient tolerated procedure well. Adequate sensory level.

## 2015-06-17 NOTE — Anesthesia Preprocedure Evaluation (Addendum)
Anesthesia Evaluation  Patient identified by MRN, date of birth, ID band Patient awake    Reviewed: Allergy & Precautions, NPO status , Patient's Chart, lab work & pertinent test results  Airway Mallampati: II  TM Distance: >3 FB Neck ROM: Full    Dental no notable dental hx. (+) Teeth Intact   Pulmonary former smoker,    Pulmonary exam normal breath sounds clear to auscultation       Cardiovascular negative cardio ROS Normal cardiovascular exam Rhythm:Regular Rate:Normal     Neuro/Psych  Headaches, negative psych ROS   GI/Hepatic negative GI ROS, Neg liver ROS,   Endo/Other  Obesity  Renal/GU negative Renal ROS  negative genitourinary   Musculoskeletal negative musculoskeletal ROS (+)   Abdominal (+) + obese,   Peds  Hematology  (+) anemia ,   Anesthesia Other Findings   Reproductive/Obstetrics (+) Pregnancy Incompetent Cervix                            Anesthesia Physical Anesthesia Plan  ASA: II  Anesthesia Plan: Spinal   Post-op Pain Management:    Induction:   Airway Management Planned: Natural Airway  Additional Equipment:   Intra-op Plan:   Post-operative Plan:   Informed Consent: I have reviewed the patients History and Physical, chart, labs and discussed the procedure including the risks, benefits and alternatives for the proposed anesthesia with the patient or authorized representative who has indicated his/her understanding and acceptance.     Plan Discussed with: Anesthesiologist, CRNA and Surgeon  Anesthesia Plan Comments:         Anesthesia Quick Evaluation

## 2015-06-17 NOTE — Brief Op Note (Signed)
06/17/2015  1:27 PM  PATIENT:  Linus MakoVontessa Penrod  29 y.o. female  PRE-OPERATIVE DIAGNOSIS:  h/o incompetent cervix  POST-OPERATIVE DIAGNOSIS:  h/o incompetent cervix  PROCEDURE:  Procedure(s): CERCLAGE CERVICAL (N/A)  SURGEON:  Surgeon(s) and Role:    * Olin Gurski [name], MD - Primary  ANESTHESIA:   spinal  EBL:  Total I/O In: 1800 [I.V.:1800] Out: 155 [Urine:150; Blood:5]  BLOOD ADMINISTERED:none  DRAINS: Urinary Catheter (Foley) to PACU   LOCAL MEDICATIONS USED:  NONE  SPECIMEN:  No Specimen  DISPOSITION OF SPECIMEN:  N/A  COUNTS:  YES  TOURNIQUET:  * No tourniquets in log *  DICTATION: .Other Dictation: Dictation Number [id number]  PLAN OF CARE: Discharge to home after PACU  PATIENT DISPOSITION:  PACU - hemodynamically stable.   Delay start of Pharmacological VTE agent (>24hrs) due to surgical blood loss or risk of bleeding: not applicable

## 2015-06-17 NOTE — Anesthesia Postprocedure Evaluation (Signed)
Anesthesia Post Note  Patient: [name]  Procedure(s) Performed: Procedure(s) (LRB): CERCLAGE CERVICAL (N/A)  Patient location during evaluation: PACU Anesthesia Type: Spinal Level of consciousness: awake and alert Pain management: pain level controlled Vital Signs Assessment: post-procedure vital signs reviewed and stable Respiratory status: spontaneous breathing, nonlabored ventilation, respiratory function stable and patient connected to nasal cannula oxygen Cardiovascular status: blood pressure returned to baseline and stable Postop Assessment: no signs of nausea or vomiting, patient able to bend at knees and spinal receding Anesthetic complications: no    Last Vitals:  Filed Vitals:   06/17/15 1645 06/17/15 1700  BP: 126/73 132/68  Pulse: 97 110  Temp:    Resp: 22 21    Last Pain:  Filed Vitals:   06/17/15 1705  PainSc: 0-No pain                 [name]

## 2015-06-18 ENCOUNTER — Encounter (HOSPITAL_COMMUNITY): Payer: Self-pay | Admitting: Obstetrics and Gynecology

## 2015-06-21 NOTE — L&D Delivery Note (Signed)
Pt was 8 cm dilated for 2-3 hours with no change. A variable was noted on monitor and upon arrival in room by nurse pt was bearing down. Pt proceeded to spontaneously delvier a viable female infant - house coverage arrived in room. Nuchal x 1 was reduced over infants head.  I arrived after delivery of baby. I proceeded to cut a segment of cord and collect cord blood. Pt received fentanyl for pain control. Placenta delivered about later intact, shultz. Fundus firm and scant bleeding noted. Vaginal inspection noted a periurethral abrasion and a superficial small perineal abrasion. Both were hemostatic so no repair needed. Baby on mothers abodmen. Apgars 9 and 9. Mother and baby stable. Counts correct

## 2015-12-04 ENCOUNTER — Inpatient Hospital Stay (HOSPITAL_COMMUNITY)
Admission: AD | Admit: 2015-12-04 | Discharge: 2015-12-05 | Disposition: A | Discharge status: Home / Self Care | Payer: Medicaid Other | Source: Ambulatory Visit | Attending: Obstetrics and Gynecology | Admitting: Obstetrics and Gynecology

## 2015-12-04 ENCOUNTER — Encounter (HOSPITAL_COMMUNITY): Payer: Self-pay | Admitting: *Deleted

## 2015-12-04 DIAGNOSIS — Z3493 Encounter for supervision of normal pregnancy, unspecified, third trimester: Secondary | ICD-10-CM | POA: Insufficient documentation

## 2015-12-04 NOTE — MAU Note (Signed)
Contractions and cramping since early today. NO BLEEDING OR LEAKING

## 2015-12-05 DIAGNOSIS — Z3493 Encounter for supervision of normal pregnancy, unspecified, third trimester: Secondary | ICD-10-CM | POA: Diagnosis not present

## 2015-12-05 LAB — URINALYSIS, ROUTINE W REFLEX MICROSCOPIC
BILIRUBIN URINE: NEGATIVE
GLUCOSE, UA: NEGATIVE mg/dL
Hgb urine dipstick: NEGATIVE
KETONES UR: NEGATIVE mg/dL
Leukocytes, UA: NEGATIVE
Nitrite: NEGATIVE
PH: 6 (ref 5.0–8.0)
Protein, ur: NEGATIVE mg/dL
SPECIFIC GRAVITY, URINE: 1.01 (ref 1.005–1.030)

## 2015-12-05 NOTE — Discharge Instructions (Signed)

## 2015-12-12 ENCOUNTER — Encounter (HOSPITAL_COMMUNITY): Payer: Self-pay

## 2015-12-12 ENCOUNTER — Inpatient Hospital Stay (HOSPITAL_COMMUNITY)
Admission: AD | Admit: 2015-12-12 | Discharge: 2015-12-14 | DRG: 775 | Disposition: A | Discharge status: Home / Self Care | Payer: Medicaid Other | Source: Ambulatory Visit | Attending: Obstetrics and Gynecology | Admitting: Obstetrics and Gynecology

## 2015-12-12 DIAGNOSIS — Z8249 Family history of ischemic heart disease and other diseases of the circulatory system: Secondary | ICD-10-CM | POA: Diagnosis not present

## 2015-12-12 DIAGNOSIS — O99824 Streptococcus B carrier state complicating childbirth: Secondary | ICD-10-CM | POA: Diagnosis present

## 2015-12-12 DIAGNOSIS — O3433 Maternal care for cervical incompetence, third trimester: Secondary | ICD-10-CM | POA: Diagnosis present

## 2015-12-12 DIAGNOSIS — Z87891 Personal history of nicotine dependence: Secondary | ICD-10-CM

## 2015-12-12 DIAGNOSIS — Z833 Family history of diabetes mellitus: Secondary | ICD-10-CM | POA: Diagnosis not present

## 2015-12-12 DIAGNOSIS — IMO0001 Reserved for inherently not codable concepts without codable children: Secondary | ICD-10-CM

## 2015-12-12 DIAGNOSIS — Z3A38 38 weeks gestation of pregnancy: Secondary | ICD-10-CM | POA: Diagnosis not present

## 2015-12-12 LAB — CBC
HEMATOCRIT: 33.2 % — AB (ref 36.0–46.0)
HEMOGLOBIN: 10.4 g/dL — AB (ref 12.0–15.0)
MCH: 21.8 pg — AB (ref 26.0–34.0)
MCHC: 31.3 g/dL (ref 30.0–36.0)
MCV: 69.5 fL — AB (ref 78.0–100.0)
PLATELETS: 255 10*3/uL (ref 150–400)
RBC: 4.78 MIL/uL (ref 3.87–5.11)
RDW: 15.3 % (ref 11.5–15.5)
WBC: 13.8 10*3/uL — ABNORMAL HIGH (ref 4.0–10.5)

## 2015-12-12 LAB — TYPE AND SCREEN
ABO/RH(D): O POS
ANTIBODY SCREEN: NEGATIVE

## 2015-12-12 LAB — OB RESULTS CONSOLE GBS: GBS: POSITIVE

## 2015-12-12 MED ORDER — PENICILLIN G POTASSIUM 5000000 UNITS IJ SOLR
5.0000 10*6.[IU] | Freq: Once | INTRAVENOUS | Status: AC
  Administered 2015-12-12: 5 10*6.[IU] via INTRAVENOUS
  Filled 2015-12-12: qty 5

## 2015-12-12 MED ORDER — ZOLPIDEM TARTRATE 5 MG PO TABS: 5.0000 mg | ORAL_TABLET | Freq: Every evening | ORAL | Status: DC | PRN

## 2015-12-12 MED ORDER — PRENATAL MULTIVITAMIN CH
1.0000 | ORAL_TABLET | Freq: Every day | ORAL | Status: DC
  Administered 2015-12-13 – 2015-12-14 (×2): 1 via ORAL
  Filled 2015-12-12 (×2): qty 1

## 2015-12-12 MED ORDER — DEXTROSE 5 % IV SOLN
2.5000 10*6.[IU] | INTRAVENOUS | Status: DC
  Filled 2015-12-12 (×2): qty 2.5

## 2015-12-12 MED ORDER — SENNOSIDES-DOCUSATE SODIUM 8.6-50 MG PO TABS
2.0000 | ORAL_TABLET | ORAL | Status: DC
  Administered 2015-12-14: 2 via ORAL
  Filled 2015-12-12: qty 2

## 2015-12-12 MED ORDER — OXYCODONE-ACETAMINOPHEN 5-325 MG PO TABS: 1.0000 | ORAL_TABLET | ORAL | Status: DC | PRN

## 2015-12-12 MED ORDER — DIPHENHYDRAMINE HCL 25 MG PO CAPS: 25.0000 mg | ORAL_CAPSULE | Freq: Four times a day (QID) | ORAL | Status: DC | PRN

## 2015-12-12 MED ORDER — LIDOCAINE HCL (PF) 1 % IJ SOLN
30.0000 mL | INTRAMUSCULAR | Status: DC | PRN
  Filled 2015-12-12: qty 30

## 2015-12-12 MED ORDER — IBUPROFEN 600 MG PO TABS
600.0000 mg | ORAL_TABLET | Freq: Four times a day (QID) | ORAL | Status: DC
  Administered 2015-12-12 – 2015-12-14 (×7): 600 mg via ORAL
  Filled 2015-12-12 (×8): qty 1

## 2015-12-12 MED ORDER — FENTANYL CITRATE (PF) 100 MCG/2ML IJ SOLN
100.0000 ug | Freq: Once | INTRAMUSCULAR | Status: AC
Start: 2015-12-12 — End: 2015-12-12
  Administered 2015-12-12: 100 ug via INTRAVENOUS

## 2015-12-12 MED ORDER — OXYTOCIN 40 UNITS IN LACTATED RINGERS INFUSION - SIMPLE MED
2.5000 [IU]/h | INTRAVENOUS | Status: DC
  Filled 2015-12-12: qty 1000

## 2015-12-12 MED ORDER — WITCH HAZEL-GLYCERIN EX PADS: 1.0000 "application " | MEDICATED_PAD | CUTANEOUS | Status: DC | PRN

## 2015-12-12 MED ORDER — FAMOTIDINE 20 MG PO TABS
40.0000 mg | ORAL_TABLET | Freq: Once | ORAL | Status: DC
Start: 2015-12-13 — End: 2015-12-13

## 2015-12-12 MED ORDER — ONDANSETRON HCL 4 MG/2ML IJ SOLN: 4.0000 mg | Freq: Four times a day (QID) | INTRAMUSCULAR | Status: DC | PRN

## 2015-12-12 MED ORDER — SOD CITRATE-CITRIC ACID 500-334 MG/5ML PO SOLN: 30.0000 mL | ORAL | Status: DC | PRN

## 2015-12-12 MED ORDER — METOCLOPRAMIDE HCL 10 MG PO TABS: 10.0000 mg | ORAL_TABLET | Freq: Once | ORAL | Status: DC

## 2015-12-12 MED ORDER — OXYTOCIN BOLUS FROM INFUSION
500.0000 mL | INTRAVENOUS | Status: DC
  Administered 2015-12-12: 100 mL via INTRAVENOUS

## 2015-12-12 MED ORDER — OXYTOCIN 10 UNIT/ML IJ SOLN
10.0000 [IU] | Freq: Once | INTRAMUSCULAR | Status: AC
  Administered 2015-12-12: 10 [IU] via INTRAMUSCULAR
  Filled 2015-12-12: qty 1

## 2015-12-12 MED ORDER — TETANUS-DIPHTH-ACELL PERTUSSIS 5-2.5-18.5 LF-MCG/0.5 IM SUSP
0.5000 mL | Freq: Once | INTRAMUSCULAR | Status: DC
  Filled 2015-12-12: qty 0.5

## 2015-12-12 MED ORDER — FLEET ENEMA 7-19 GM/118ML RE ENEM: 1.0000 | ENEMA | RECTAL | Status: DC | PRN

## 2015-12-12 MED ORDER — OXYCODONE-ACETAMINOPHEN 5-325 MG PO TABS: 2.0000 | ORAL_TABLET | ORAL | Status: DC | PRN

## 2015-12-12 MED ORDER — ACETAMINOPHEN 325 MG PO TABS: 650.0000 mg | ORAL_TABLET | ORAL | Status: DC | PRN

## 2015-12-12 MED ORDER — BUTORPHANOL TARTRATE 1 MG/ML IJ SOLN
1.0000 mg | Freq: Once | INTRAMUSCULAR | Status: AC
  Administered 2015-12-12: 1 mg via INTRAVENOUS
  Filled 2015-12-12: qty 1

## 2015-12-12 MED ORDER — BENZOCAINE-MENTHOL 20-0.5 % EX AERO
1.0000 "application " | INHALATION_SPRAY | CUTANEOUS | Status: DC | PRN
  Filled 2015-12-12: qty 56

## 2015-12-12 MED ORDER — SIMETHICONE 80 MG PO CHEW: 80.0000 mg | CHEWABLE_TABLET | ORAL | Status: DC | PRN

## 2015-12-12 MED ORDER — ONDANSETRON HCL 4 MG/2ML IJ SOLN: 4.0000 mg | INTRAMUSCULAR | Status: DC | PRN

## 2015-12-12 MED ORDER — LACTATED RINGERS IV SOLN
INTRAVENOUS | Status: DC
  Administered 2015-12-12: 15:00:00 via INTRAVENOUS

## 2015-12-12 MED ORDER — LACTATED RINGERS IV SOLN: INTRAVENOUS | Status: DC

## 2015-12-12 MED ORDER — LACTATED RINGERS IV SOLN
500.0000 mL | INTRAVENOUS | Status: DC | PRN
Start: 2015-12-12 — End: 2015-12-12

## 2015-12-12 MED ORDER — FENTANYL CITRATE (PF) 100 MCG/2ML IJ SOLN
INTRAMUSCULAR | Status: AC
  Administered 2015-12-12: 100 ug via INTRAVENOUS
  Filled 2015-12-12: qty 2

## 2015-12-12 MED ORDER — DIBUCAINE 1 % RE OINT
1.0000 "application " | TOPICAL_OINTMENT | RECTAL | Status: DC | PRN
  Filled 2015-12-12: qty 56.7

## 2015-12-12 MED ORDER — COCONUT OIL OIL
1.0000 "application " | TOPICAL_OIL | Status: DC | PRN
  Filled 2015-12-12: qty 120

## 2015-12-12 MED ORDER — ONDANSETRON HCL 4 MG PO TABS: 4.0000 mg | ORAL_TABLET | ORAL | Status: DC | PRN

## 2015-12-12 NOTE — MAU Note (Signed)
Patient states started leaking fluid around 1:00 today clear fluid, patient has history of cerclage which  Was clipped at 36 weeks, denies any other prenatal problems.

## 2015-12-12 NOTE — MAU Note (Signed)
Notified Dr. Mindi SlickerBanga patient G8P3 SROM 1:00 today , 6 cm/90/-2 fhr reactive, received admit orders.

## 2015-12-12 NOTE — MAU Note (Signed)
Patient presents with c/o leaking of fluid.

## 2015-12-12 NOTE — Progress Notes (Signed)
Attempted to urinate, but unable to.

## 2015-12-12 NOTE — H&P (Signed)
[name] is a 30 y.o. female presenting for LOF and contractions. Pt with hx of second trimester losses so had first trimester cerclage placed. Removed at 36weeks and was 4-5cm dil. On arrival at MAU today she was 6cm; now 6-7cm. She also has a history of herpes - started prophylactically at 36weeks.No active lesions at this time. Dating is based on lmp and confirmed with first trimester u/s.  She is GBS + Pt desires pp BTL - papers signed at 28weeks   Maternal Medical History:  Reason for admission: Contractions.  Nausea.  Contractions: Onset was 3-5 hours ago.   Frequency: irregular.   Perceived severity is moderate.    Fetal activity: Perceived fetal activity is normal.   Last perceived fetal movement was within the past hour.    Prenatal complications: Incompetent cervix  Prenatal Complications - Diabetes: none.    OB History    Gravida Para Term Preterm AB TAB SAB Ectopic Multiple Living   8 4 3  3  3   0 3     Past Medical History  Diagnosis Date  . 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  . Preterm labor   . Headache    Past Surgical History  Procedure Laterality Date  . Cholecystectomy  2008  . Cervical cerclage    . Cervical cerclage  03/30/2012    Procedure: CERCLAGE CERVICAL;  Surgeon: Sherron MondayJody Bovard, MD;  Location: [facility];  Service: Gynecology;  Laterality: N/A;  . Dilation and evacuation  04/07/2012    Procedure: DILATATION AND EVACUATION;  Surgeon: Oliver PilaKathy W Richardson, MD;  Location: [facility];  Service: Gynecology;  Laterality: N/A;  . Cervical cerclage N/A 08/19/2013    Procedure: CERCLAGE CERVICAL;  Surgeon: Sherron MondayJody Bovard, MD;  Location: [facility];  Service: Gynecology;  Laterality: N/A;  . Dilation and curettage of uterus    . Cervical cerclage N/A 06/17/2015    Procedure: CERCLAGE CERVICAL;  Surgeon: Sherian ReinJody [name], MD;  Location: [facility];  Service: Gynecology;  Laterality: N/A;   Family History: family history  includes Diabetes in her mother; Heart disease in her mother; Hypertension in her mother; Kidney disease in her mother. Social History:  reports that she quit smoking about 5 years ago. Her smoking use included Cigarettes. She has a .5 pack-year smoking history. She has never used smokeless tobacco. She reports that she does not drink alcohol or use illicit drugs.   Prenatal Transfer Tool  Maternal Diabetes: No Genetic Screening: Declined Maternal Ultrasounds/Referrals: Abnormal:  Findings:   Fetal Kidney Anomalies, Isolated EIF (echogenic intracardiac focus) Fetal Ultrasounds or other Referrals:  None Maternal Substance Abuse:  No Significant Maternal Medications:  Meds include: Other: valtrex Significant Maternal Lab Results:  Lab values include: Group B Strep positive Other Comments:  None  Review of Systems  Constitutional: Negative for fever, chills, weight loss and malaise/fatigue.  Eyes: Negative for blurred vision.  Respiratory: Negative for shortness of breath.   Cardiovascular: Negative for chest pain.  Gastrointestinal: Positive for abdominal pain. Negative for heartburn, nausea and vomiting.  Genitourinary: Negative for dysuria.  Musculoskeletal: Negative for back pain.  Skin: Negative for itching and rash.  Neurological: Negative for dizziness and headaches.  Psychiatric/Behavioral: Negative for depression, suicidal ideas, hallucinations and substance abuse. The patient is not nervous/anxious.     Dilation: 6.5 Effacement (%): 80 Station: -2 Exam by:: Dr. Mindi SlickerBanga Blood pressure 119/70, pulse 98, temperature 98.7 F (37.1 C), temperature source Oral, resp. rate 17, height  5\' 8"  (1.727 m), weight 231 lb (104.781 kg), last menstrual period 03/21/2015, unknown if currently breastfeeding. Maternal Exam:  Uterine Assessment: Contraction strength is mild.  Contraction frequency is irregular.   Abdomen: Patient reports generalized tenderness.  Estimated fetal weight is AGA.    Fetal presentation: vertex  Introitus: Normal vulva. Vulva is negative for condylomata and lesion.  Normal vagina.  Vagina is negative for condylomata and discharge.  Ferning test: not done.   Pelvis: adequate for delivery.   Cervix: Cervix evaluated by digital exam.     Physical Exam  Constitutional: She is oriented to person, place, and time. She appears well-developed and well-nourished.  Neck: Normal range of motion.  Cardiovascular: Normal rate.   Respiratory: Effort normal.  GI: Soft. There is generalized tenderness.  Genitourinary: Vagina normal and uterus normal. Vulva exhibits no lesion. No vaginal discharge found.  Musculoskeletal: Normal range of motion.  Neurological: She is alert and oriented to person, place, and time.  Skin: Skin is warm.  Psychiatric: She has a normal mood and affect. Her behavior is normal. Judgment and thought content normal.    Prenatal labs: ABO, Rh:   Antibody:   Rubella:   RPR:    HBsAg:    HIV:    GBS: Positive (06/24 1557)   Assessment/Plan: [id number] at [redacted]weeks gestation on valtrex with no active lesions; here in active labor GBS prophylaxis with PCN AROM performed with small amount of clear amniotic fluid Pain control prn Desires pp BTL - will coordinate with anesthesia/OR Anticipate svd   [name] 12/12/2015, 4:02 PM

## 2015-12-12 NOTE — Progress Notes (Signed)
Delivery Note:  Pt delivered precipitously in the bed with nurse present. Upon my arrival Baby delivered in the bed with vigorous cry. Baby to mom's chest. Cord clamped x 2 and cut. Dr. Mindi SlickerBanga arrived to complete deliver of placenta.

## 2015-12-13 ENCOUNTER — Encounter (HOSPITAL_COMMUNITY): Payer: Self-pay | Admitting: Anesthesiology

## 2015-12-13 ENCOUNTER — Encounter (HOSPITAL_COMMUNITY)
Admission: AD | Disposition: A | Discharge status: Home / Self Care | Payer: Self-pay | Source: Ambulatory Visit | Attending: Obstetrics and Gynecology

## 2015-12-13 HISTORY — PX: TUBAL LIGATION: SHX77

## 2015-12-13 LAB — CBC
HEMATOCRIT: 31 % — AB (ref 36.0–46.0)
HEMOGLOBIN: 9.8 g/dL — AB (ref 12.0–15.0)
MCH: 21.8 pg — AB (ref 26.0–34.0)
MCHC: 31.6 g/dL (ref 30.0–36.0)
MCV: 69 fL — AB (ref 78.0–100.0)
Platelets: 249 10*3/uL (ref 150–400)
RBC: 4.49 MIL/uL (ref 3.87–5.11)
RDW: 15.4 % (ref 11.5–15.5)
WBC: 19.1 10*3/uL — AB (ref 4.0–10.5)

## 2015-12-13 LAB — SURGICAL PCR SCREEN
MRSA, PCR: NEGATIVE
Staphylococcus aureus: NEGATIVE

## 2015-12-13 LAB — RPR: RPR Ser Ql: NONREACTIVE

## 2015-12-13 SURGERY — LIGATION, FALLOPIAN TUBE, POSTPARTUM
Anesthesia: Regional | Laterality: Bilateral

## 2015-12-13 MED ORDER — MEDROXYPROGESTERONE ACETATE 150 MG/ML IM SUSP
150.0000 mg | Freq: Once | INTRAMUSCULAR | Status: AC
  Administered 2015-12-13: 150 mg via INTRAMUSCULAR
  Filled 2015-12-13: qty 1

## 2015-12-13 SURGICAL SUPPLY — 30 items
BENZOIN TINCTURE PRP APPL 2/3 (GAUZE/BANDAGES/DRESSINGS) IMPLANT
BLADE SURG 11 STRL SS (BLADE) IMPLANT
CLOSURE WOUND 1/2 X4 (GAUZE/BANDAGES/DRESSINGS)
CLOTH BEACON ORANGE TIMEOUT ST (SAFETY) IMPLANT
CONTAINER PREFILL 10% NBF 15ML (MISCELLANEOUS) IMPLANT
DRSG OPSITE POSTOP 3X4 (GAUZE/BANDAGES/DRESSINGS) IMPLANT
DURAPREP 26ML APPLICATOR (WOUND CARE) IMPLANT
ELECT REM PT RETURN 9FT ADLT (ELECTROSURGICAL)
ELECTRODE REM PT RTRN 9FT ADLT (ELECTROSURGICAL) IMPLANT
GLOVE BIO SURGEON STRL SZ 6.5 (GLOVE) IMPLANT
GLOVE BIO SURGEONS STRL SZ 6.5 (GLOVE)
GLOVE BIOGEL PI IND STRL 7.0 (GLOVE) IMPLANT
GLOVE BIOGEL PI INDICATOR 7.0 (GLOVE)
GOWN STRL REUS W/TWL LRG LVL3 (GOWN DISPOSABLE) IMPLANT
NEEDLE HYPO 22GX1.5 SAFETY (NEEDLE) IMPLANT
NS IRRIG 1000ML POUR BTL (IV SOLUTION) IMPLANT
PACK ABDOMINAL MINOR (CUSTOM PROCEDURE TRAY) IMPLANT
PENCIL BUTTON HOLSTER BLD 10FT (ELECTRODE) IMPLANT
PROTECTOR NERVE ULNAR (MISCELLANEOUS) IMPLANT
SPONGE LAP 4X18 X RAY DECT (DISPOSABLE) IMPLANT
STRIP CLOSURE SKIN 1/2X4 (GAUZE/BANDAGES/DRESSINGS) IMPLANT
SUT PLAIN 0 NONE (SUTURE) IMPLANT
SUT VIC AB 0 CT1 27 (SUTURE)
SUT VIC AB 0 CT1 27XBRD ANBCTR (SUTURE) IMPLANT
SUT VIC AB 2-0 SH 27 (SUTURE)
SUT VIC AB 2-0 SH 27XBRD (SUTURE) IMPLANT
SUT VIC AB 3-0 FS2 27 (SUTURE) IMPLANT
SYR CONTROL 10ML LL (SYRINGE) IMPLANT
TOWEL OR 17X24 6PK STRL BLUE (TOWEL DISPOSABLE) IMPLANT
TRAY FOLEY CATH SILVER 14FR (SET/KITS/TRAYS/PACK) IMPLANT

## 2015-12-13 NOTE — Lactation Note (Signed)
This note was copied from a baby's chart. Lactation Consultation Note; Mom request for me to see her again. Natalie CornfieldStephanie RN had assisted mom with latch. Mom reports no pain with latch. Encouragement given. Reviewed feeding cues and encouraged to feed whenever she sees them No questions at present. To call for assist prn  Patient Name: [name] FAOZH'YToday's Date: 12/13/2015 Reason for consult: Initial assessment   Maternal Data Formula Feeding for Exclusion: No Does the patient have breastfeeding experience prior to this delivery?: No  Feeding Feeding Type: Breast Fed Length of feed: 14 min  LATCH Score/Interventions Latch: Grasps breast easily, tongue down, lips flanged, rhythmical sucking. Intervention(s): Adjust position;Assist with latch;Breast massage;Breast compression  Audible Swallowing: A few with stimulation  Type of Nipple: Everted at rest and after stimulation  Comfort (Breast/Nipple): Soft / non-tender     Hold (Positioning): Assistance needed to correctly position infant at breast and maintain latch.  LATCH Score: 8  Lactation Tools Discussed/Used     Consult Status Consult Status: Follow-up Date: 12/13/15 Follow-up type: In-patient    Pamelia HoitWeeks, Jannet Calip D 12/13/2015, 12:23 PM

## 2015-12-13 NOTE — Progress Notes (Signed)
Post Partum Day 1 Subjective: no complaints, up ad lib, voiding, tolerating PO, + flatus and bonding well with baby - breastfeeding. Pt decided against btl - admits had doubts about it to begin with. Interested in other options.   Objective: Blood pressure 115/59, pulse 85, temperature 97.9 F (36.6 C), temperature source Oral, resp. rate 18, height 5\' 8"  (1.727 m), weight 231 lb (104.781 kg), last menstrual period 03/21/2015, unknown if currently breastfeeding.  Physical Exam:  General: alert, cooperative and no distress Lochia: appropriate Uterine Fundus: firm Incision: n/a DVT Evaluation: No evidence of DVT seen on physical exam.   Recent Labs  12/12/15 1515 12/13/15 0523  HGB 10.4* 9.8*  HCT 33.2* 31.0*    Assessment/Plan: Plan for discharge tomorrow, Breastfeeding and Contraception depo provera prior to discharge ; would like nexplanon in office   LOS: 1 day   [name] 12/13/2015, 10:09 AM

## 2015-12-13 NOTE — Discharge Summary (Signed)
OB Discharge Summary     Patient Name: [name] DOB: 04-04-1986 MRN: [id number]  Date of admission: 12/12/2015 Delivering MD: Pryor OchoaBANGA, CECILIA [facility]   Date of discharge: 12/13/2015  Admitting diagnosis: 38 WKS, WATER BROKE desires sterilization  Intrauterine pregnancy: 2071w0d     Secondary diagnosis:  Active Problems:   Active labor at term  Additional problems: none     Discharge diagnosis: Term Pregnancy Delivered                                                                                                Post partum procedures:none - pt changed her mind about pp tubal  Augmentation: AROM  Complications: None  Hospital course:  Onset of Labor With Vaginal Delivery     30 y.o. yo [id number] at 7871w0d was admitted in Active Labor on 12/12/2015. Patient had an uncomplicated labor course as follows:  Membrane Rupture Time/Date: 3:45 PM ,12/12/2015   Intrapartum Procedures: Episiotomy: None [1]                                         Lacerations:  Periurethral [8]  Patient had a delivery of a Viable infant. 12/12/2015  Information for the patient's newborn:  [name], [name] [[id number]][[id number]]  Delivery Method: Vaginal, Spontaneous Delivery (Filed from Delivery Summary)    Pateint had an uncomplicated postpartum course.  She is ambulating, tolerating a regular diet, passing flatus, and urinating well. Patient is discharged home in stable condition on 12/13/2015.    Physical exam  Filed Vitals:   12/12/15 2002 12/12/15 2145 12/12/15 2245 12/13/15 0515  BP: 139/64 120/65 111/66 115/59  Pulse: 106 98 98 85  Temp:  98.3 F (36.8 C) 98.3 F (36.8 C) 97.9 F (36.6 C)  TempSrc:  Oral Oral Oral  Resp:  18 16 18   Height:      Weight:       General: alert, cooperative and no distress Lochia: appropriate Uterine Fundus: firm Incision: N/A DVT Evaluation: No evidence of DVT seen on physical exam. Labs: Lab Results  Component Value Date   WBC 19.1* 12/13/2015   HGB 9.8* 12/13/2015   HCT 31.0* 12/13/2015   MCV 69.0* 12/13/2015   PLT 249 12/13/2015   CMP Latest Ref Rng 01/15/2012  Glucose 70 - 99 mg/dL 91  BUN 6 - 23 mg/dL 11  Creatinine 1.300.50 - 8.651.10 mg/dL 7.840.70  Sodium 696135 - 295145 mEq/L 136  Potassium 3.5 - 5.1 mEq/L 3.4(L)  Chloride 96 - 112 mEq/L 101  CO2 19 - 32 mEq/L 24  Calcium 8.4 - 10.5 mg/dL 9.1    Discharge instruction: per After Visit Summary and "Baby and Me Booklet".  After visit meds:    Medication List    Notice    You have not been prescribed any medications.      Diet: routine diet  Activity: Advance as tolerated. Pelvic rest for 6 weeks.   Outpatient follow up:4-5weeks Follow up Appt:No future appointments. Follow up Visit:No Follow-up on  file.  Postpartum contraception: Depo Provera, Nexplanon and - get nexplanon in office; depo prior to discharge  Newborn Data: Live born female  Birth Weight: 8 lb 2 oz (3685 g) APGAR: 9, 9  Baby Feeding: Breast Disposition:home with mother   12/13/2015 Sharol Givenecilia Worema Banga, DO

## 2015-12-13 NOTE — Lactation Note (Signed)
This note was copied from a baby's chart. Lactation Consultation Note; Initial visit with mom. She is on the phone and asked her caller to hold on. Reports baby has latched but she needs some help- offered assist with latch but mom declines at present. To call for assist prn. BF brochure given with resources for support after DC.   Patient Name: [name] WUJWJ'XToday's Date: 12/13/2015 Reason for consult: Initial assessment   Maternal Data Formula Feeding for Exclusion: No Does the patient have breastfeeding experience prior to this delivery?: No  Feeding Feeding Type: Breast Fed  LATCH Score/Interventions Latch: Repeated attempts needed to sustain latch, nipple held in mouth throughout feeding, stimulation needed to elicit sucking reflex. Intervention(s): Adjust position;Assist with latch;Breast massage;Breast compression  Audible Swallowing: A few with stimulation Intervention(s): Skin to skin;Hand expression  Type of Nipple: Everted at rest and after stimulation  Comfort (Breast/Nipple): Soft / non-tender     Hold (Positioning): Assistance needed to correctly position infant at breast and maintain latch.  LATCH Score: 7  Lactation Tools Discussed/Used     Consult Status Consult Status: Follow-up Date: 12/13/15 Follow-up type: In-patient    Pamelia HoitWeeks, Emmy Keng D 12/13/2015, 9:38 AM

## 2015-12-14 ENCOUNTER — Encounter (HOSPITAL_COMMUNITY): Payer: Self-pay | Admitting: Obstetrics and Gynecology

## 2015-12-14 MED ORDER — PRENATAL MULTIVITAMIN CH: 1.0000 | ORAL_TABLET | Freq: Every day | ORAL | Status: DC

## 2015-12-14 MED ORDER — IBUPROFEN 800 MG PO TABS: 800.0000 mg | ORAL_TABLET | Freq: Four times a day (QID) | ORAL | Status: DC

## 2015-12-14 NOTE — Progress Notes (Signed)
CM / UR chart review completed.  

## 2015-12-14 NOTE — Lactation Note (Signed)
This note was copied from a baby's chart. Lactation Consultation Note  Patient Name: Natalie Vic BlackbirdVontessa Salmons ZOXWR'UToday's Date: 12/14/2015 Reason for consult: Follow-up assessment   Follow up with first time BF mom of 42 hour old infant. Infant with 10 BF for 10-30 minutes, 4 voids and 3 stools in last 24 hours. Infant weight 7lb 10.2 oz with 6% weight loss since birth. LATCH Scores 8-10 by bedside RN's.  Mom reports BF is going well, she reports the nurses have helped her and her nipple soreness is improved. Enc her to place EBM to nipples post feed.   Reviewed all Bf information in Taking Care of Baby and Me Booklet. Reviewed positioning, BF Basics, Engorgement prevention/treatment, comfort pumping, pre pumping to soften areola, and  I/O. Enc mom to maintain feeding log and take to Ped appt. [facility]. She has a manual pump and says she knows how to use it. She reports she is able to hand express colostrum.  Reviewed LC Brochure, mom aware of OP Services, BF Support Groups and LC phone #. Enc mom to call with questions/concerns prn. Mom without questions at this time.    Maternal Data Has patient been taught Hand Expression?: Yes Does the patient have breastfeeding experience prior to this delivery?: No  Feeding Length of feed: 10 min  LATCH Score/Interventions                      Lactation Tools Discussed/Used WIC Program: No (Plans to apply) Pump Review: Milk Storage   Consult Status Consult Status: Complete Follow-up type: Call as needed    Ed BlalockSharon S Thaila Bottoms 12/14/2015, 1:21 PM

## 2015-12-14 NOTE — Progress Notes (Signed)
Post Partum Day 2 Subjective: no complaints, up ad lib, voiding, tolerating PO and nl lochia, pain controlled  Objective: Blood pressure 123/72, pulse 78, temperature 97.7 F (36.5 C), temperature source Oral, resp. rate 18, height 5\' 8"  (1.727 m), weight 104.781 kg (231 lb), last menstrual period 03/21/2015, unknown if currently breastfeeding.  Physical Exam:  General: alert and no distress Lochia: appropriate Uterine Fundus: firm   Recent Labs  12/12/15 1515 12/13/15 0523  HGB 10.4* 9.8*  HCT 33.2* 31.0*    Assessment/Plan: Discharge home, Breastfeeding and Lactation consult.  D/c with motrin and PNV.  F/u 6week   LOS: 2 days   [name], Rolanda Campa 12/14/2015, 7:17 AM

## 2017-01-20 NOTE — Telephone Encounter (Signed)
See note

## 2017-04-16 ENCOUNTER — Inpatient Hospital Stay (HOSPITAL_COMMUNITY)
Admission: AD | Admit: 2017-04-16 | Discharge: 2017-04-17 | Disposition: A | Discharge status: Home / Self Care | Payer: Self-pay | Source: Ambulatory Visit | Attending: Obstetrics and Gynecology | Admitting: Obstetrics and Gynecology

## 2017-04-16 ENCOUNTER — Encounter (HOSPITAL_COMMUNITY): Payer: Self-pay

## 2017-04-16 DIAGNOSIS — Z9049 Acquired absence of other specified parts of digestive tract: Secondary | ICD-10-CM | POA: Insufficient documentation

## 2017-04-16 DIAGNOSIS — Z87891 Personal history of nicotine dependence: Secondary | ICD-10-CM | POA: Insufficient documentation

## 2017-04-16 DIAGNOSIS — Z79899 Other long term (current) drug therapy: Secondary | ICD-10-CM | POA: Insufficient documentation

## 2017-04-16 DIAGNOSIS — N61 Mastitis without abscess: Secondary | ICD-10-CM | POA: Insufficient documentation

## 2017-04-16 DIAGNOSIS — Z9889 Other specified postprocedural states: Secondary | ICD-10-CM | POA: Insufficient documentation

## 2017-04-16 DIAGNOSIS — N644 Mastodynia: Secondary | ICD-10-CM | POA: Insufficient documentation

## 2017-04-16 DIAGNOSIS — Z841 Family history of disorders of kidney and ureter: Secondary | ICD-10-CM | POA: Insufficient documentation

## 2017-04-16 DIAGNOSIS — Z791 Long term (current) use of non-steroidal anti-inflammatories (NSAID): Secondary | ICD-10-CM | POA: Insufficient documentation

## 2017-04-16 DIAGNOSIS — Z833 Family history of diabetes mellitus: Secondary | ICD-10-CM | POA: Insufficient documentation

## 2017-04-16 DIAGNOSIS — Z8249 Family history of ischemic heart disease and other diseases of the circulatory system: Secondary | ICD-10-CM | POA: Insufficient documentation

## 2017-04-16 MED ORDER — IBUPROFEN 800 MG PO TABS
800.0000 mg | ORAL_TABLET | Freq: Once | ORAL | Status: AC
  Administered 2017-04-17: 800 mg via ORAL
  Filled 2017-04-16: qty 1

## 2017-04-16 NOTE — MAU Note (Signed)
Pt presents to MAU with c/o right nipple pain that started today and sje noticed small bump on nipple yesterday. Pt denies vaginal bleeding/discharge.

## 2017-04-16 NOTE — MAU Provider Note (Signed)
History     CSN: [id number]  Arrival date and time: 04/16/17 2146     Chief Complaint  Patient presents with  . Nipple Pain   HPI  [name] is a [id number] non-pregnant female presenting to MAU with complaints of RT nipple pain. She states she had a bump to come up on the RT nipple about 3 wks ago, it popped and drained some pus, then it went away. She noticed a small, painful bump on the RT nipple yesterday. She stopped breastfeeding in April 2018.   Past Medical History:  Diagnosis Date  . Headache   . 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  . Preterm labor     Past Surgical History:  Procedure Laterality Date  . CERVICAL CERCLAGE    . CERVICAL CERCLAGE  03/30/2012   Procedure: CERCLAGE CERVICAL;  Surgeon: Sherron Monday, MD;  Location: [facility];  Service: Gynecology;  Laterality: N/A;  . CERVICAL CERCLAGE N/A 08/19/2013   Procedure: CERCLAGE CERVICAL;  Surgeon: Sherron Monday, MD;  Location: [facility];  Service: Gynecology;  Laterality: N/A;  . CERVICAL CERCLAGE N/A 06/17/2015   Procedure: CERCLAGE CERVICAL;  Surgeon: Sherian Rein, MD;  Location: [facility];  Service: Gynecology;  Laterality: N/A;  . CHOLECYSTECTOMY  2008  . DILATION AND CURETTAGE OF UTERUS    . DILATION AND EVACUATION  04/07/2012   Procedure: DILATATION AND EVACUATION;  Surgeon: Oliver Pila, MD;  Location: [facility];  Service: Gynecology;  Laterality: N/A;  . TUBAL LIGATION Bilateral 12/13/2015   Procedure: POST PARTUM TUBAL LIGATION;  Surgeon: Edwinna Areola, DO;  Location: [facility];  Service: Gynecology;  Laterality: Bilateral;    Family History  Problem Relation Age of Onset  . Diabetes Mother   . Hypertension Mother   . Kidney disease Mother   . Heart disease Mother     Social History  Substance Use Topics  . Smoking status: Former Smoker    Packs/day: 0.25    Years: 2.00    Types: Cigarettes    Quit date: 08/15/2010  . Smokeless tobacco: Never  Used  . Alcohol use Yes     Comment: Social     Allergies: No Known Allergies  Prescriptions Prior to Admission  Medication Sig Dispense Refill Last Dose  . ibuprofen (ADVIL,MOTRIN) 800 MG tablet Take 1 tablet (800 mg total) by mouth every 6 (six) hours. 45 tablet 1 Past Month at Unknown time  . Prenatal Vit-Fe Fumarate-FA (PRENATAL MULTIVITAMIN) TABS tablet Take 1 tablet by mouth daily at 12 noon. 100 tablet 3 More than a month at Unknown time    Review of Systems  Constitutional: Negative.   HENT: Negative.   Eyes: Negative.   Respiratory: Negative.   Cardiovascular: Negative.   Gastrointestinal: Negative.   Genitourinary: Negative.   Musculoskeletal: Negative.   Skin: Positive for wound (painful bump on RT nipple).   Physical Exam   Blood pressure 140/69, pulse 78, temperature 98.3 F (36.8 C), temperature source Oral, resp. rate 18, not currently breastfeeding.  Physical Exam  Media Information     Document Information   Photos    04/16/2017 23:30  Attached To:  Hospital Encounter on 04/16/17  Source Information   Raelyn Mora, CNM  [facility]    MAU Course  Procedures  MDM Ibuprofen 800 mg PO -- improved pain Warm compress  *Consult with Dr. Emelda Fear @ 0030 - notified of patient's complaints, assessments, recommended tx plan Rx Keflex, continue  warm compress and Motrin and refer to [facility]   Assessment and Plan  Nipple infection in female - Rx for Keflex 500 QID x 7 days - Information provided on breast tenderness & Keflex  Discharge home Patient verbalized an understanding of the plan of care and agrees.   Raelyn Moraolitta Retia Cordle, MSN, CNM 04/16/2017, 11:32 PM

## 2017-04-17 ENCOUNTER — Other Ambulatory Visit: Payer: Self-pay | Admitting: Obstetrics and Gynecology

## 2017-04-17 DIAGNOSIS — N61 Mastitis without abscess: Secondary | ICD-10-CM

## 2017-04-17 MED ORDER — CEPHALEXIN 500 MG PO CAPS: 500.0000 mg | ORAL_CAPSULE | Freq: Four times a day (QID) | ORAL | 0 refills | Status: DC

## 2017-04-17 NOTE — Progress Notes (Signed)
Future order entered for nipple infection  Raelyn MoraRolitta Serene Kopf, CNM  04/17/2017 1:46 AM

## 2017-04-24 ENCOUNTER — Inpatient Hospital Stay (HOSPITAL_COMMUNITY)
Admission: AD | Admit: 2017-04-24 | Discharge: 2017-04-24 | Disposition: A | Discharge status: Home / Self Care | Payer: Self-pay | Source: Ambulatory Visit | Attending: Obstetrics & Gynecology | Admitting: Obstetrics & Gynecology

## 2017-04-24 DIAGNOSIS — Z8249 Family history of ischemic heart disease and other diseases of the circulatory system: Secondary | ICD-10-CM | POA: Insufficient documentation

## 2017-04-24 DIAGNOSIS — Z833 Family history of diabetes mellitus: Secondary | ICD-10-CM | POA: Insufficient documentation

## 2017-04-24 DIAGNOSIS — Z841 Family history of disorders of kidney and ureter: Secondary | ICD-10-CM | POA: Insufficient documentation

## 2017-04-24 DIAGNOSIS — Z87891 Personal history of nicotine dependence: Secondary | ICD-10-CM | POA: Insufficient documentation

## 2017-04-24 DIAGNOSIS — A084 Viral intestinal infection, unspecified: Secondary | ICD-10-CM | POA: Insufficient documentation

## 2017-04-24 LAB — URINALYSIS, ROUTINE W REFLEX MICROSCOPIC
BILIRUBIN URINE: NEGATIVE
GLUCOSE, UA: NEGATIVE mg/dL
Hgb urine dipstick: NEGATIVE
KETONES UR: NEGATIVE mg/dL
Leukocytes, UA: NEGATIVE
Nitrite: NEGATIVE
PH: 6 (ref 5.0–8.0)
Protein, ur: NEGATIVE mg/dL
SPECIFIC GRAVITY, URINE: 1.01 (ref 1.005–1.030)

## 2017-04-24 LAB — POCT PREGNANCY, URINE: Preg Test, Ur: NEGATIVE

## 2017-04-24 MED ORDER — ONDANSETRON 4 MG PO TBDP: 4.0000 mg | ORAL_TABLET | Freq: Three times a day (TID) | ORAL | 0 refills | Status: DC | PRN

## 2017-04-24 NOTE — Discharge Instructions (Signed)

## 2017-04-24 NOTE — MAU Note (Signed)
Pt presents to MAU with complaints of lower abdominal cramping, N/VD for a couple of days.

## 2017-04-24 NOTE — MAU Provider Note (Signed)
History     CSN: [id number]  Arrival date and time: 04/24/17 1358   None     Chief Complaint  Patient presents with  . Nausea  . Emesis  . Diarrhea   31 yo nonpregnant female here for nausea and diarrhea x couple days. Says she has been taking keflex for skin infection and symptoms seem worse with medication, especially when she does not eat with meds. Denies fever, sweats, chills, body aches. Her son is sick with the same.     OB History    Gravida Para Term Preterm AB Living   8 5 4   3 4    SAB TAB Ectopic Multiple Live Births   3     0 4      Past Medical History:  Diagnosis Date  . Headache   . 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  . Preterm labor     Past Surgical History:  Procedure Laterality Date  . CERVICAL CERCLAGE    . CHOLECYSTECTOMY  2008  . DILATION AND CURETTAGE OF UTERUS      Family History  Problem Relation Age of Onset  . Diabetes Mother   . Hypertension Mother   . Kidney disease Mother   . Heart disease Mother     Social History   Tobacco Use  . Smoking status: Former Smoker    Packs/day: 0.25    Years: 2.00    Pack years: 0.50    Types: Cigarettes    Last attempt to quit: 08/15/2010    Years since quitting: 6.6  . Smokeless tobacco: Never Used  Substance Use Topics  . Alcohol use: Yes    Comment: Social   . Drug use: No    Allergies: No Known Allergies  Medications Prior to Admission  Medication Sig Dispense Refill Last Dose  . cephALEXin (KEFLEX) 500 MG capsule Take 1 capsule (500 mg total) by mouth 4 (four) times daily. 20 capsule 0   . ibuprofen (ADVIL,MOTRIN) 800 MG tablet Take 1 tablet (800 mg total) by mouth every 6 (six) hours. 45 tablet 1 Past Month at Unknown time  . Prenatal Vit-Fe Fumarate-FA (PRENATAL MULTIVITAMIN) TABS tablet Take 1 tablet by mouth daily at 12 noon. 100 tablet 3 More than a month at Unknown time    Review of Systems  Constitutional: Negative for chills,  fatigue and fever.  HENT: Negative for congestion and dental problem.   Eyes: Negative for discharge and itching.  Respiratory: Negative for apnea and chest tightness.   Cardiovascular: Negative for chest pain and leg swelling.  Gastrointestinal: Positive for abdominal pain, diarrhea and nausea. Negative for vomiting.  Endocrine: Negative for cold intolerance and heat intolerance.  Genitourinary: Negative for difficulty urinating and dyspareunia.  Musculoskeletal: Negative for arthralgias and back pain.  Neurological: Negative for dizziness and headaches.  Hematological: Negative for adenopathy. Does not bruise/bleed easily.   Physical Exam   Blood pressure 133/68, pulse 67, temperature 98.3 F (36.8 C), resp. rate 17, last menstrual period 04/24/2017, unknown if currently breastfeeding.  Physical Exam  Constitutional: She is oriented to person, place, and time. She appears well-developed and well-nourished.  HENT:  Head: Normocephalic and atraumatic.  Eyes: Conjunctivae are normal. Pupils are equal, round, and reactive to light.  Neck: Normal range of motion.  Cardiovascular: Normal rate and intact distal pulses.  Respiratory: Effort normal. No respiratory distress.  GI: Soft. She exhibits no distension and no mass. There is no tenderness. There  is no rebound and no guarding.  Musculoskeletal: Normal range of motion. She exhibits no edema.  Neurological: She is alert and oriented to person, place, and time.  Skin: Skin is warm and dry.  Psychiatric: She has a normal mood and affect.    MAU Course  Procedures  MDM Suspect viral gastroenteritis given known sick contacts. Could be antibiotic causing abdominal discomfort.  Assessment and Plan  1. Viral gastroenteritis- advised supportive care. zofran for nausea.follow up outpatient.  [employer] Chiyo Fay 04/24/2017, 8:58 PM

## 2017-04-24 NOTE — MAU Note (Addendum)
Pt. Feels like the diarrhea and vomiting is coming from the antibiotics she has been taken.  Prescribed antibotics for "bump on her nipples" course of treatment  was taken for five days.

## 2017-05-03 ENCOUNTER — Other Ambulatory Visit: Payer: Self-pay

## 2017-05-22 ENCOUNTER — Other Ambulatory Visit: Payer: Self-pay

## 2017-10-11 ENCOUNTER — Emergency Department (HOSPITAL_COMMUNITY): Payer: Self-pay

## 2017-10-11 ENCOUNTER — Emergency Department (HOSPITAL_COMMUNITY)
Admission: EM | Admit: 2017-10-11 | Discharge: 2017-10-12 | Disposition: A | Discharge status: Home / Self Care | Payer: Self-pay | Attending: Emergency Medicine | Admitting: Emergency Medicine

## 2017-10-11 ENCOUNTER — Encounter (HOSPITAL_COMMUNITY): Payer: Self-pay

## 2017-10-11 DIAGNOSIS — J36 Peritonsillar abscess: Secondary | ICD-10-CM | POA: Insufficient documentation

## 2017-10-11 DIAGNOSIS — Z87891 Personal history of nicotine dependence: Secondary | ICD-10-CM | POA: Insufficient documentation

## 2017-10-11 DIAGNOSIS — Z79899 Other long term (current) drug therapy: Secondary | ICD-10-CM | POA: Insufficient documentation

## 2017-10-11 LAB — CBC WITH DIFFERENTIAL/PLATELET
BASOS PCT: 1 %
Basophils Absolute: 0.1 10*3/uL (ref 0.0–0.1)
EOS ABS: 0.1 10*3/uL (ref 0.0–0.7)
Eosinophils Relative: 1 %
HCT: 36.4 % (ref 36.0–46.0)
HEMOGLOBIN: 12.6 g/dL (ref 12.0–15.0)
Lymphocytes Relative: 17 %
Lymphs Abs: 2.6 10*3/uL (ref 0.7–4.0)
MCH: 29.4 pg (ref 26.0–34.0)
MCHC: 34.6 g/dL (ref 30.0–36.0)
MCV: 85 fL (ref 78.0–100.0)
Monocytes Absolute: 1.1 10*3/uL — ABNORMAL HIGH (ref 0.1–1.0)
Monocytes Relative: 7 %
Neutro Abs: 11.6 10*3/uL — ABNORMAL HIGH (ref 1.7–7.7)
Neutrophils Relative %: 74 %
Platelets: 245 10*3/uL (ref 150–400)
RBC: 4.28 MIL/uL (ref 3.87–5.11)
RDW: 12.8 % (ref 11.5–15.5)
WBC: 15.4 10*3/uL — ABNORMAL HIGH (ref 4.0–10.5)

## 2017-10-11 LAB — I-STAT CHEM 8, ED
BUN: 3 mg/dL — AB (ref 6–20)
CALCIUM ION: 1.09 mmol/L — AB (ref 1.15–1.40)
Chloride: 100 mmol/L — ABNORMAL LOW (ref 101–111)
Creatinine, Ser: 0.7 mg/dL (ref 0.44–1.00)
Glucose, Bld: 103 mg/dL — ABNORMAL HIGH (ref 65–99)
HCT: 38 % (ref 36.0–46.0)
Hemoglobin: 12.9 g/dL (ref 12.0–15.0)
Potassium: 3.5 mmol/L (ref 3.5–5.1)
SODIUM: 139 mmol/L (ref 135–145)
TCO2: 27 mmol/L (ref 22–32)

## 2017-10-11 LAB — GROUP A STREP BY PCR: GROUP A STREP BY PCR: DETECTED — AB

## 2017-10-11 MED ORDER — IOPAMIDOL (ISOVUE-300) INJECTION 61%
INTRAVENOUS | Status: AC
  Filled 2017-10-11: qty 100

## 2017-10-11 MED ORDER — DEXAMETHASONE SODIUM PHOSPHATE 10 MG/ML IJ SOLN
10.0000 mg | Freq: Once | INTRAMUSCULAR | Status: AC
  Administered 2017-10-11: 10 mg via INTRAVENOUS
  Filled 2017-10-11: qty 1

## 2017-10-11 MED ORDER — IOPAMIDOL (ISOVUE-300) INJECTION 61%
75.0000 mL | Freq: Once | INTRAVENOUS | Status: AC | PRN
  Administered 2017-10-11: 75 mL via INTRAVENOUS

## 2017-10-11 MED ORDER — KETOROLAC TROMETHAMINE 30 MG/ML IJ SOLN
30.0000 mg | Freq: Once | INTRAMUSCULAR | Status: AC
  Administered 2017-10-11: 30 mg via INTRAVENOUS
  Filled 2017-10-11: qty 1

## 2017-10-11 NOTE — ED Triage Notes (Signed)
Patient complains of 3 days of cough, body aches sore throat and earache, no relief with otc meds.

## 2017-10-11 NOTE — ED Notes (Signed)
Xray reviewed.  No changes in acuity at this time

## 2017-10-11 NOTE — ED Provider Notes (Signed)
[facility] EMERGENCY DEPARTMENT Provider Note   CSN: [id number] Arrival date & time: 10/11/17  1647     History   Chief Complaint Chief Complaint  Patient presents with  . bodyaches, sore throat    HPI [name] is a 32 y.o. female who presents to the emergency department with a chief complaint of sore throat.  The patient endorses sudden onset, worsening sore throat that is worse on the right that began 3 days ago, pain radiates to the right ear.  She reports difficulty swallowing, feeling as if her throat is closing, drooling, and a change in her voice to where it sounds more muffled.  She states that she has barely been talking because the pain is so severe.  She reports associated subjective fever, diaphoresis, headache, productive cough with yellow sputum, myalgias, chills, and generalized malaise.  She also endorses diarrhea that began 3 days ago, but resolved after 1 day.  No emesis, abdominal pain, dyspnea, shortness of breath, neck pain or stiffness, back pain, or rash.    She has been treating her symptoms with TheraFlu with no improvement.  Sick contacts include her 74-year-old daughter at home who was diagnosed with a viral gastroenteritis within the last week.  She initially denies dental pain, but later states that she has a right mandibular tooth that has been broken and causing her pain for some time.   No chronic medical problems or daily medications.  The history is provided by the patient. No language interpreter was used.    Past Medical History:  Diagnosis Date  . Headache   . 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  . Preterm labor     Patient Active Problem List   Diagnosis Date Noted  . Nipple infection in female 04/17/2017  . Active labor at term 12/12/2015  . Fetal demise 2014/05/22  . IUFD (intrauterine fetal death) 22-May-2014  . Abdominal pain in pregnancy   . [redacted] weeks gestation of  pregnancy   . Active labor 12/06/2013  . SVD (spontaneous vaginal delivery) 12/06/2013  . Urethral lesion 05/08/2013  . 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 CERVICAL CERCLAGE    . CERVICAL CERCLAGE  03/30/2012   Procedure: CERCLAGE CERVICAL;  Surgeon: Sherron Monday, MD;  Location: [facility];  Service: Gynecology;  Laterality: N/A;  . CERVICAL CERCLAGE N/A 08/19/2013   Procedure: CERCLAGE CERVICAL;  Surgeon: Sherron Monday, MD;  Location: [facility];  Service: Gynecology;  Laterality: N/A;  . CERVICAL CERCLAGE N/A 06/17/2015   Procedure: CERCLAGE CERVICAL;  Surgeon: Sherian Rein, MD;  Location: [facility];  Service: Gynecology;  Laterality: N/A;  . CHOLECYSTECTOMY  2008  . DILATION AND CURETTAGE OF UTERUS    . DILATION AND EVACUATION  04/07/2012   Procedure: DILATATION AND EVACUATION;  Surgeon: Oliver Pila, MD;  Location: [facility];  Service: Gynecology;  Laterality: N/A;  . TUBAL LIGATION Bilateral 12/13/2015   Procedure: POST PARTUM TUBAL LIGATION;  Surgeon: Edwinna Areola, DO;  Location: [facility];  Service: Gynecology;  Laterality: Bilateral;     OB History    Gravida  8   Para  5   Term  4   Preterm      AB  3   Living  4     SAB  3   TAB      Ectopic      Multiple  0  Live Births  4            Home Medications    Prior to Admission medications   Medication Sig Start Date End Date Taking? Authorizing Provider  amoxicillin-clavulanate (AUGMENTIN) 875-125 MG tablet Take 1 tablet by mouth every 12 (twelve) hours for 14 days. 10/12/17 10/26/17  McDonald, Mia A, PA-C  ibuprofen (ADVIL,MOTRIN) 800 MG tablet Take 1 tablet (800 mg total) by mouth 3 (three) times daily. 10/12/17   McDonald, Mia A, PA-C  ondansetron (ZOFRAN ODT) 4 MG disintegrating tablet Take 1 tablet (4 mg total) every 8 (eight) hours as needed by mouth for nausea or vomiting. 04/24/17   Moss, Amber, DO  oxyCODONE-acetaminophen  (PERCOCET/ROXICET) 5-325 MG tablet Take 1 tablet by mouth every 8 (eight) hours as needed for severe pain. 10/12/17   McDonald, Mia A, PA-C  Prenatal Vit-Fe Fumarate-FA (PRENATAL MULTIVITAMIN) TABS tablet Take 1 tablet by mouth daily at 12 noon. 12/14/15   Bovard-StuckertAugusto Gamble, MD    Family History Family History  Problem Relation Age of Onset  . Diabetes Mother   . Hypertension Mother   . Kidney disease Mother   . Heart disease Mother     Social History Social History   Tobacco Use  . Smoking status: Former Smoker    Packs/day: 0.25    Years: 2.00    Pack years: 0.50    Types: Cigarettes    Last attempt to quit: 08/15/2010    Years since quitting: 7.1  . Smokeless tobacco: Never Used  Substance Use Topics  . Alcohol use: Yes    Comment: Social   . Drug use: No     Allergies   Patient has no known allergies.   Review of Systems Review of Systems  Constitutional: Positive for chills, diaphoresis and fever. Negative for activity change.  HENT: Positive for drooling, ear pain, sore throat, trouble swallowing and voice change. Negative for ear discharge, mouth sores, postnasal drip and rhinorrhea.   Eyes: Negative for visual disturbance.  Respiratory: Positive for cough. Negative for shortness of breath.   Cardiovascular: Negative for chest pain.  Gastrointestinal: Positive for diarrhea (resolved). Negative for abdominal pain, nausea and vomiting.  Genitourinary: Negative for dysuria and vaginal pain.  Musculoskeletal: Negative for back pain, neck pain and neck stiffness.  Skin: Negative for rash.  Allergic/Immunologic: Negative for immunocompromised state.  Neurological: Positive for headaches. Negative for dizziness, weakness and numbness.  Psychiatric/Behavioral: Negative for confusion.     Physical Exam Updated Vital Signs BP (!) 99/50 (BP Location: Right Arm)   Pulse 90   Temp 98.6 F (37 C) (Oral)   Resp 16   SpO2 99%   Physical Exam  Constitutional: No  distress.  Ill appearing, but nontoxic.   HENT:  Head: Normocephalic.  Right Ear: Hearing normal. A middle ear effusion is present.  Left Ear: Hearing normal. A middle ear effusion is present.  Nose: Rhinorrhea present. Right sinus exhibits no maxillary sinus tenderness and no frontal sinus tenderness. Left sinus exhibits no maxillary sinus tenderness and no frontal sinus tenderness.  Mouth/Throat: Uvula is midline. She does not have dentures. No oral lesions. No trismus in the jaw. Abnormal dentition. Dental caries present. No dental abscesses, uvula swelling or lacerations. Posterior oropharyngeal edema and posterior oropharyngeal erythema present. No oropharyngeal exudate or tonsillar abscesses. No tonsillar exudate.    No sublingual induration, edema, or fluctuance.  Eyes: Conjunctivae are normal.  Neck: Neck supple.  Significantly tender with light palpation to the  right side of the anterior neck.  No significant induration, but there does appear to be mild right-sided facial and neck edema as compared to left.  No meningismus.  Cardiovascular: Normal rate, regular rhythm, normal heart sounds and intact distal pulses. Exam reveals no gallop and no friction rub.  No murmur heard. Pulmonary/Chest: Effort normal. No stridor. No respiratory distress. She has no wheezes. She has no rales. She exhibits no tenderness.  Abdominal: Soft. She exhibits no distension.  Neurological: She is alert.  Skin: Skin is warm. Capillary refill takes less than 2 seconds. No rash noted. She is diaphoretic.  Psychiatric: Her behavior is normal.  Nursing note and vitals reviewed.    ED Treatments / Results  Labs (all labs ordered are listed, but only abnormal results are displayed) Labs Reviewed  GROUP A STREP BY PCR - Abnormal; Notable for the following components:      Result Value   Group A Strep by PCR DETECTED (*)    All other components within normal limits  CBC WITH DIFFERENTIAL/PLATELET -  Abnormal; Notable for the following components:   WBC 15.4 (*)    Neutro Abs 11.6 (*)    Monocytes Absolute 1.1 (*)    All other components within normal limits  I-STAT CHEM 8, ED - Abnormal; Notable for the following components:   Chloride 100 (*)    BUN 3 (*)    Glucose, Bld 103 (*)    Calcium, Ion 1.09 (*)    All other components within normal limits    EKG None  Radiology Dg Chest 2 View  Result Date: 10/11/2017 CLINICAL DATA:  Fever and productive cough x4 days. EXAM: CHEST - 2 VIEW COMPARISON:  None. FINDINGS: The heart size and mediastinal contours are within normal limits. Both lungs are clear. The visualized skeletal structures are unremarkable. IMPRESSION: No active cardiopulmonary disease. Electronically Signed   By: Tollie Eth M.D.   On: 10/11/2017 19:29   Ct Soft Tissue Neck W Contrast  Result Date: 10/11/2017 CLINICAL DATA:  Cough, sore throat and earache. EXAM: CT NECK WITH CONTRAST TECHNIQUE: Multidetector CT imaging of the neck was performed using the standard protocol following the bolus administration of intravenous contrast. CONTRAST:  75mL ISOVUE-300 IOPAMIDOL (ISOVUE-300) INJECTION 61% COMPARISON:  None. FINDINGS: PHARYNX AND LARYNX: Enlarged adenoids and bilateral palatine tonsils with heterogeneous enhancement. RIGHT peritonsillar 13 x 16 mm irregular fluid collection extending to the parapharyngeal fat tissue planes. RIGHT neck and retropharyngeal effusion. Effaced oropharyngeal airway. Edema effaces RIGHT vallecula and extends to the RIGHT glossopharyngeal fold and false cords. Normal larynx. SALIVARY GLANDS: Effusion inflammatory changes extending into RIGHT submandibular space. Major salivary glands otherwise unremarkable. THYROID: Normal. LYMPH NODES: Bilateral cervical lymphadenopathy measuring to 17 mm RIGHT level IIa with reniform morphology and homogeneous enhancement. VASCULAR: Inflammatory changes stent to RIGHT carotid space. Patent cervical vessels.  LIMITED INTRACRANIAL: Normal. VISUALIZED ORBITS: Normal. MASTOIDS AND VISUALIZED PARANASAL SINUSES: Mild LEFT maxillary sinus mucosal thickening. Included mastoid air cells are well aerated. SKELETON: Nonacute.  Multiple dental caries. UPPER CHEST: Lung apices are clear. No superior mediastinal lymphadenopathy. OTHER: None. IMPRESSION: 1. Acute severe tonsillitis and pharyngitis. 13 x 16 mm RIGHT peritonsillar abscess. 2. Transspatial edema and effusion.  Effaced oropharyngeal airway. 3. Reactive cervical lymphadenopathy. Electronically Signed   By: Awilda Metro M.D.   On: 10/11/2017 23:38    Procedures Procedures (including critical care time)  Medications Ordered in ED Medications  iopamidol (ISOVUE-300) 61 % injection (has no administration in time range)  dexamethasone (DECADRON) injection 10 mg (10 mg Intravenous Given 10/11/17 2228)  ketorolac (TORADOL) 30 MG/ML injection 30 mg (30 mg Intravenous Given 10/11/17 2228)  iopamidol (ISOVUE-300) 61 % injection 75 mL (75 mLs Intravenous Contrast Given 10/11/17 2313)  clindamycin (CLEOCIN) IVPB 600 mg (0 mg Intravenous Stopped 10/12/17 0038)  sodium chloride 0.9 % bolus 1,000 mL (0 mLs Intravenous Stopped 10/12/17 0137)     Initial Impression / Assessment and Plan / ED Course  I have reviewed the triage vital signs and the nursing notes.  Pertinent labs & imaging results that were available during my care of the patient were reviewed by me and considered in my medical decision making (see chart for details).     32 year old female presenting with right-sided sore throat, fever, chills, general malaise, drooling, muffled voice, productive cough with yellow sputum, and dental pain x3 days.  Temperature 100.3 on arrival she is tachycardic in the 100s.   On physical exam, the patient is ill-appearing.  Pain appears out of proportion to exam when palpating the right anterolateral neck.  Posterior oropharynx is very erythematous and edematous.   She also has a fractured right mandibular tooth.  Decadron and Toradol given for pain control and inflammation.  Leukocytosis of 15.4 with elevated absolute neutrophil count of almost 12.  Strep A PCR is positive.  CT soft tissue neck with acute severe tonsillitis and pharyngitis and a 13 x 16 mm right peritonsillar abscess, trans-spatial edema and effusion, but oropharyngeal airway is intact.  IV clindamycin initiated in the ED.  Discussed the patient with Dr. Annalee GentaShoemaker, ENT, who agreed with IV antibiotics and recommended adding an IV fluid bolus, discharging the patient home with p.o. antibiotics, and follow-up in the clinic if symptoms did not start to improve.  This plan was discussed with the patient is agreeable at this time.  Strict return precautions given.  She is hemodynamically stable, in no acute distress, and reports improvement in her symptoms since arrival.  The patient is safe for discharge home with outpatient follow-up at this time.  Final Clinical Impressions(s) / ED Diagnoses   Final diagnoses:  Peritonsillar abscess    ED Discharge Orders        Ordered    amoxicillin-clavulanate (AUGMENTIN) 875-125 MG tablet  Every 12 hours     10/12/17 0114    ibuprofen (ADVIL,MOTRIN) 800 MG tablet  3 times daily     10/12/17 0114    oxyCODONE-acetaminophen (PERCOCET/ROXICET) 5-325 MG tablet  Every 8 hours PRN     10/12/17 0114       McDonald, Mia A, PA-C 10/12/17 0217    Mesner, Barbara CowerJason, MD 10/12/17 1506

## 2017-10-11 NOTE — ED Notes (Signed)
This RN called CT and informed pt is ready for CT

## 2017-10-12 MED ORDER — CLINDAMYCIN PHOSPHATE 600 MG/50ML IV SOLN
600.0000 mg | Freq: Once | INTRAVENOUS | Status: AC
  Administered 2017-10-12: 600 mg via INTRAVENOUS
  Filled 2017-10-12: qty 50

## 2017-10-12 MED ORDER — OXYCODONE-ACETAMINOPHEN 5-325 MG PO TABS: 1.0000 | ORAL_TABLET | Freq: Three times a day (TID) | ORAL | 0 refills | Status: DC | PRN

## 2017-10-12 MED ORDER — AMOXICILLIN-POT CLAVULANATE 875-125 MG PO TABS: 1.0000 | ORAL_TABLET | Freq: Two times a day (BID) | ORAL | 0 refills | Status: AC

## 2017-10-12 MED ORDER — SODIUM CHLORIDE 0.9 % IV BOLUS
1000.0000 mL | Freq: Once | INTRAVENOUS | Status: AC
  Administered 2017-10-12: 1000 mL via INTRAVENOUS

## 2017-10-12 MED ORDER — IBUPROFEN 800 MG PO TABS: 800.0000 mg | ORAL_TABLET | Freq: Three times a day (TID) | ORAL | 0 refills | Status: DC

## 2017-10-12 NOTE — Discharge Instructions (Signed)
You for allowing me to take care of you today in the emergency department.  Your CT scan today showed an abscess around the right tonsil that is causing your symptoms.  You received a dose of IV clindamycin, which is an antibiotic, Toradol for pain control, and Decadron to help with swelling in the throat and neck.  Start taking Augmentin twice daily for the next 2 weeks.  It is important that you finish all of this medication even if your symptoms start to improve.  If your pain, swelling, ability to swallow, and muffled voice do not start to improve within the next few days, please call and schedule follow-up appointment with Dr. Annalee GentaShoemaker.  If you are unable to afford this medication at the pharmacy for any reason, it is important that you call the hospital back so that we can try to find another suitable medication.  For pain control, take 800 mg of ibuprofen with food once every 8 hours or 1000 mg of Tylenol once every 8 hours.  For severe pain, you can take 1 tablet of Percocet, but this medication is a narcotic and you should not drive or work while taking this medication because it can cause you to be impaired.  Percocet also contains 325 mg of Tylenol.  Do not take more than 4000 mg of Tylenol in a 24-hour.   If you develop worsening symptoms including feelings of your throat is closing, as if you are not able to breathe or gasping for air, high fever despite taking ibuprofen or Tylenol, or if your symptoms significantly worsen or you develop any other new concerning symptoms, please return to the emergency department for re-evaluation.

## 2018-02-06 ENCOUNTER — Encounter: Payer: Self-pay | Admitting: Family Medicine

## 2018-03-23 ENCOUNTER — Other Ambulatory Visit: Payer: Self-pay

## 2018-03-23 ENCOUNTER — Encounter (HOSPITAL_COMMUNITY): Payer: Self-pay | Admitting: Emergency Medicine

## 2018-03-23 ENCOUNTER — Ambulatory Visit (HOSPITAL_COMMUNITY)
Admission: EM | Admit: 2018-03-23 | Discharge: 2018-03-23 | Disposition: A | Discharge status: Home / Self Care | Payer: Self-pay | Attending: Family Medicine | Admitting: Family Medicine

## 2018-03-23 DIAGNOSIS — Z87891 Personal history of nicotine dependence: Secondary | ICD-10-CM | POA: Insufficient documentation

## 2018-03-23 DIAGNOSIS — B9689 Other specified bacterial agents as the cause of diseases classified elsewhere: Secondary | ICD-10-CM | POA: Insufficient documentation

## 2018-03-23 DIAGNOSIS — Z8249 Family history of ischemic heart disease and other diseases of the circulatory system: Secondary | ICD-10-CM | POA: Insufficient documentation

## 2018-03-23 DIAGNOSIS — Z791 Long term (current) use of non-steroidal anti-inflammatories (NSAID): Secondary | ICD-10-CM | POA: Insufficient documentation

## 2018-03-23 DIAGNOSIS — N760 Acute vaginitis: Secondary | ICD-10-CM | POA: Insufficient documentation

## 2018-03-23 DIAGNOSIS — Z79899 Other long term (current) drug therapy: Secondary | ICD-10-CM | POA: Insufficient documentation

## 2018-03-23 LAB — POCT URINALYSIS DIP (DEVICE)
BILIRUBIN URINE: NEGATIVE
Glucose, UA: NEGATIVE mg/dL
Hgb urine dipstick: NEGATIVE
KETONES UR: NEGATIVE mg/dL
Leukocytes, UA: NEGATIVE
Nitrite: NEGATIVE
PH: 6 (ref 5.0–8.0)
Protein, ur: NEGATIVE mg/dL
Specific Gravity, Urine: 1.015 (ref 1.005–1.030)
Urobilinogen, UA: 0.2 mg/dL (ref 0.0–1.0)

## 2018-03-23 LAB — POCT PREGNANCY, URINE: PREG TEST UR: NEGATIVE

## 2018-03-23 MED ORDER — FLUCONAZOLE 150 MG PO TABS: 150.0000 mg | ORAL_TABLET | Freq: Once | ORAL | 0 refills | Status: AC

## 2018-03-23 NOTE — ED Provider Notes (Signed)
[facility]    CSN: [id number] Arrival date & time: 03/23/18  1509     History   Chief Complaint Chief Complaint  Patient presents with  . Vaginal Itching    HPI [name] is a 32 y.o. female presenting today for evaluation of vaginal itching.  Patient has had vaginal discomfort for the past week.  She feels as if this is similar to her prior yeast infection.  She denies any discharge or odor.  Denies urinary symptoms of dysuria or increased frequency.  LMP was 9/19.  Denies using birth control.  States that she had one episode of BV previously, but feels this is more like yeast.  Denies concerns for STDs.  Denies fever, abdominal pain, nausea or vomiting.  HPI  Past Medical History:  Diagnosis Date  . Headache   . 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  . Preterm labor     Patient Active Problem List   Diagnosis Date Noted  . Nipple infection in female 04/17/2017  . Active labor at term 12/12/2015  . Fetal demise 2014/06/03  . IUFD (intrauterine fetal death) 03-Jun-2014  . Abdominal pain in pregnancy   . [redacted] weeks gestation of pregnancy   . Active labor 12/06/2013  . SVD (spontaneous vaginal delivery) 12/06/2013  . Urethral lesion 05/08/2013  . 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 CERVICAL CERCLAGE    . CERVICAL CERCLAGE  03/30/2012   Procedure: CERCLAGE CERVICAL;  Surgeon: Sherron Monday, MD;  Location: [facility];  Service: Gynecology;  Laterality: N/A;  . CERVICAL CERCLAGE N/A 08/19/2013   Procedure: CERCLAGE CERVICAL;  Surgeon: Sherron Monday, MD;  Location: [facility];  Service: Gynecology;  Laterality: N/A;  . CERVICAL CERCLAGE N/A 06/17/2015   Procedure: CERCLAGE CERVICAL;  Surgeon: Sherian Rein, MD;  Location: [facility];  Service: Gynecology;  Laterality: N/A;  . CHOLECYSTECTOMY  2008  . DILATION AND CURETTAGE OF UTERUS    . DILATION AND EVACUATION   04/07/2012   Procedure: DILATATION AND EVACUATION;  Surgeon: Oliver Pila, MD;  Location: [facility];  Service: Gynecology;  Laterality: N/A;  . TUBAL LIGATION Bilateral 12/13/2015   Procedure: POST PARTUM TUBAL LIGATION;  Surgeon: Edwinna Areola, DO;  Location: [facility];  Service: Gynecology;  Laterality: Bilateral;    OB History    Gravida  8   Para  5   Term  4   Preterm      AB  3   Living  4     SAB  3   TAB      Ectopic      Multiple  0   Live Births  4            Home Medications    Prior to Admission medications   Medication Sig Start Date End Date Taking? Authorizing Provider  fluconazole (DIFLUCAN) 150 MG tablet Take 1 tablet (150 mg total) by mouth once for 1 dose. 03/23/18 03/23/18  Cedrick Partain C, PA-C  ibuprofen (ADVIL,MOTRIN) 800 MG tablet Take 1 tablet (800 mg total) by mouth 3 (three) times daily. 10/12/17   McDonald, Mia A, PA-C  ondansetron (ZOFRAN ODT) 4 MG disintegrating tablet Take 1 tablet (4 mg total) every 8 (eight) hours as needed by mouth for nausea or vomiting. 04/24/17   Moss, Amber, DO  oxyCODONE-acetaminophen (PERCOCET/ROXICET) 5-325 MG tablet Take 1 tablet by mouth every 8 (eight) hours  as needed for severe pain. 10/12/17   McDonald, Mia A, PA-C  Prenatal Vit-Fe Fumarate-FA (PRENATAL MULTIVITAMIN) TABS tablet Take 1 tablet by mouth daily at 12 noon. 12/14/15   Bovard-StuckertAugusto Gamble, MD    Family History Family History  Problem Relation Age of Onset  . Diabetes Mother   . Hypertension Mother   . Kidney disease Mother   . Heart disease Mother     Social History Social History   Tobacco Use  . Smoking status: Former Smoker    Packs/day: 0.25    Years: 2.00    Pack years: 0.50    Types: Cigarettes    Last attempt to quit: 08/15/2010    Years since quitting: 7.6  . Smokeless tobacco: Never Used  Substance Use Topics  . Alcohol use: Yes    Comment: Social   . Drug use: No     Allergies   Patient has no known  allergies.   Review of Systems Review of Systems  Constitutional: Negative for fever.  Respiratory: Negative for shortness of breath.   Cardiovascular: Negative for chest pain.  Gastrointestinal: Negative for abdominal pain, diarrhea, nausea and vomiting.  Genitourinary: Negative for dysuria, flank pain, genital sores, hematuria, menstrual problem, vaginal bleeding, vaginal discharge and vaginal pain.  Musculoskeletal: Negative for back pain.  Skin: Negative for rash.  Neurological: Negative for dizziness, light-headedness and headaches.     Physical Exam Triage Vital Signs ED Triage Vitals [03/23/18 1551]  Enc Vitals Group     BP (!) 117/59     Pulse Rate 89     Resp      Temp 98.6 F (37 C)     Temp Source Oral     SpO2 100 %     Weight      Height      Head Circumference      Peak Flow      Pain Score 0     Pain Loc      Pain Edu?      Excl. in GC?    No data found.  Updated Vital Signs BP (!) 117/59 (BP Location: Left Arm)   Pulse 89   Temp 98.6 F (37 C) (Oral)   LMP 03/08/2018   SpO2 100%   Visual Acuity Right Eye Distance:   Left Eye Distance:   Bilateral Distance:    Right Eye Near:   Left Eye Near:    Bilateral Near:     Physical Exam  Constitutional: She is oriented to person, place, and time. She appears well-developed and well-nourished.  No acute distress  HENT:  Head: Normocephalic and atraumatic.  Nose: Nose normal.  Eyes: Conjunctivae are normal.  Neck: Neck supple.  Cardiovascular: Normal rate.  Pulmonary/Chest: Effort normal. No respiratory distress.  Abdominal: Soft. She exhibits no distension. There is no tenderness.  Nontender to light deep palpation throughout abdomen  Genitourinary:  Genitourinary Comments: Deferred  Musculoskeletal: Normal range of motion.  Neurological: She is alert and oriented to person, place, and time.  Skin: Skin is warm and dry.  Psychiatric: She has a normal mood and affect.  Nursing note and  vitals reviewed.    UC Treatments / Results  Labs (all labs ordered are listed, but only abnormal results are displayed) Labs Reviewed  POCT URINALYSIS DIP (DEVICE)  POCT PREGNANCY, URINE  CERVICOVAGINAL ANCILLARY ONLY    EKG None  Radiology No results found.  Procedures Procedures (including critical care time)  Medications Ordered in UC  Medications - No data to display  Initial Impression / Assessment and Plan / UC Course  I have reviewed the triage vital signs and the nursing notes.  Pertinent labs & imaging results that were available during my care of the patient were reviewed by me and considered in my medical decision making (see chart for details).     Patient with vaginal itching, will empirically treat for yeast.  Diflucan provided.  Vaginal swab obtained and will send off to confirm yeast versus other causes.  Will call patient with results and alter treatment as needed. Discussed strict return precautions. Patient verbalized understanding and is agreeable with plan.  Final Clinical Impressions(s) / UC Diagnoses   Final diagnoses:  Vaginitis and vulvovaginitis     Discharge Instructions     We will go ahead and treat you for yeast infection, please take 1 tablet of Diflucan today.  You may repeat in 3 days if still having symptoms.  We are testing you for Gonorrhea, Chlamydia, Trichomonas, Yeast and Bacterial Vaginosis. We will call you if anything is positive and let you know if you require any further treatment. Please inform partners of any positive results.   Please return if symptoms not improving with treatment, development of fever, nausea, vomiting, abdominal pain.    ED Prescriptions    Medication Sig Dispense Auth. Provider   fluconazole (DIFLUCAN) 150 MG tablet Take 1 tablet (150 mg total) by mouth once for 1 dose. 2 tablet Rigdon Macomber C, PA-C     Controlled Substance Prescriptions [place] Controlled Substance Registry consulted? Not  Applicable   Lew Dawes, [place] 03/23/18 1653

## 2018-03-23 NOTE — Discharge Instructions (Signed)
We will go ahead and treat you for yeast infection, please take 1 tablet of Diflucan today.  You may repeat in 3 days if still having symptoms.  We are testing you for Gonorrhea, Chlamydia, Trichomonas, Yeast and Bacterial Vaginosis. We will call you if anything is positive and let you know if you require any further treatment. Please inform partners of any positive results.   Please return if symptoms not improving with treatment, development of fever, nausea, vomiting, abdominal pain.

## 2018-03-23 NOTE — ED Triage Notes (Signed)
Pt reports vaginal itching x1 week and states it is a yeast infection.  She also reports that she thinks it is in her mouth as well.

## 2018-03-26 ENCOUNTER — Ambulatory Visit: Payer: Medicaid Other | Admitting: Obstetrics and Gynecology

## 2018-03-26 LAB — CERVICOVAGINAL ANCILLARY ONLY
Bacterial vaginitis: POSITIVE — AB
Candida vaginitis: NEGATIVE
Chlamydia: NEGATIVE
NEISSERIA GONORRHEA: NEGATIVE
TRICH (WINDOWPATH): NEGATIVE

## 2018-03-28 ENCOUNTER — Telehealth (HOSPITAL_COMMUNITY): Payer: Self-pay | Admitting: Family Medicine

## 2018-03-28 MED ORDER — METRONIDAZOLE 500 MG PO TABS: 500.0000 mg | ORAL_TABLET | Freq: Two times a day (BID) | ORAL | 0 refills | Status: DC

## 2018-03-28 NOTE — Telephone Encounter (Signed)
Patient's results positive for bacterial vaginosis Reports she is still having symptoms. Flagyl 500 mg twice daily x7 days sent to pharmacy for treatment.

## 2018-04-04 ENCOUNTER — Encounter (HOSPITAL_COMMUNITY): Payer: Self-pay | Admitting: Emergency Medicine

## 2018-04-04 ENCOUNTER — Ambulatory Visit (HOSPITAL_COMMUNITY)
Admission: EM | Admit: 2018-04-04 | Discharge: 2018-04-04 | Disposition: A | Discharge status: Home / Self Care | Payer: Self-pay | Attending: Family Medicine | Admitting: Family Medicine

## 2018-04-04 DIAGNOSIS — R519 Headache, unspecified: Secondary | ICD-10-CM

## 2018-04-04 DIAGNOSIS — R51 Headache: Secondary | ICD-10-CM

## 2018-04-04 MED ORDER — BUTALBITAL-APAP-CAFFEINE 50-325-40 MG PO TABS: 1.0000 | ORAL_TABLET | Freq: Four times a day (QID) | ORAL | 0 refills | Status: AC | PRN

## 2018-04-04 NOTE — ED Provider Notes (Signed)
[facility]    CSN: [id number] Arrival date & time: 04/04/18  1754     History   Chief Complaint No chief complaint on file.   HPI [name] is a 32 y.o. female.   She has a 2-day history of right-sided headache.  There is been no nausea vomiting no visual symptoms.  There is no history of trauma.  There is no history of congestion.  She does have 4 small children in the room with her which are hers when asked about stress she denies.  HPI  Past Medical History:  Diagnosis Date  . Headache   . 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  . Preterm labor     Patient Active Problem List   Diagnosis Date Noted  . Nipple infection in female 04/17/2017  . Active labor at term 12/12/2015  . Fetal demise May 16, 2014  . IUFD (intrauterine fetal death) 16-May-2014  . Abdominal pain in pregnancy   . [redacted] weeks gestation of pregnancy   . Active labor 12/06/2013  . SVD (spontaneous vaginal delivery) 12/06/2013  . Urethral lesion 05/08/2013  . 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 CERVICAL CERCLAGE    . CERVICAL CERCLAGE  03/30/2012   Procedure: CERCLAGE CERVICAL;  Surgeon: Sherron Monday, MD;  Location: [facility];  Service: Gynecology;  Laterality: N/A;  . CERVICAL CERCLAGE N/A 08/19/2013   Procedure: CERCLAGE CERVICAL;  Surgeon: Sherron Monday, MD;  Location: [facility];  Service: Gynecology;  Laterality: N/A;  . CERVICAL CERCLAGE N/A 06/17/2015   Procedure: CERCLAGE CERVICAL;  Surgeon: Sherian Rein, MD;  Location: [facility];  Service: Gynecology;  Laterality: N/A;  . CHOLECYSTECTOMY  2008  . DILATION AND CURETTAGE OF UTERUS    . DILATION AND EVACUATION  04/07/2012   Procedure: DILATATION AND EVACUATION;  Surgeon: Oliver Pila, MD;  Location: [facility];  Service: Gynecology;  Laterality: N/A;  . TUBAL LIGATION Bilateral 12/13/2015   Procedure: POST PARTUM TUBAL LIGATION;   Surgeon: Edwinna Areola, DO;  Location: [facility];  Service: Gynecology;  Laterality: Bilateral;    OB History    Gravida  8   Para  5   Term  4   Preterm      AB  3   Living  4     SAB  3   TAB      Ectopic      Multiple  0   Live Births  4            Home Medications    Prior to Admission medications   Medication Sig Start Date End Date Taking? Authorizing Provider  ibuprofen (ADVIL,MOTRIN) 800 MG tablet Take 1 tablet (800 mg total) by mouth 3 (three) times daily. 10/12/17   McDonald, Mia A, PA-C  metroNIDAZOLE (FLAGYL) 500 MG tablet Take 1 tablet (500 mg total) by mouth 2 (two) times daily. 03/28/18   Bast, Gloris Manchester A, NP  ondansetron (ZOFRAN ODT) 4 MG disintegrating tablet Take 1 tablet (4 mg total) every 8 (eight) hours as needed by mouth for nausea or vomiting. 04/24/17   Moss, Amber, DO  oxyCODONE-acetaminophen (PERCOCET/ROXICET) 5-325 MG tablet Take 1 tablet by mouth every 8 (eight) hours as needed for severe pain. 10/12/17   McDonald, Mia A, PA-C  Prenatal Vit-Fe Fumarate-FA (PRENATAL MULTIVITAMIN) TABS tablet Take 1 tablet by mouth daily at 12 noon. 12/14/15   Sherian Rein, MD  Family History Family History  Problem Relation Age of Onset  . Diabetes Mother   . Hypertension Mother   . Kidney disease Mother   . Heart disease Mother     Social History Social History   Tobacco Use  . Smoking status: Former Smoker    Packs/day: 0.25    Years: 2.00    Pack years: 0.50    Types: Cigarettes    Last attempt to quit: 08/15/2010    Years since quitting: 7.6  . Smokeless tobacco: Never Used  Substance Use Topics  . Alcohol use: Yes    Comment: Social   . Drug use: No     Allergies   Patient has no known allergies.   Review of Systems Review of Systems  Constitutional: Negative for chills and fever.  HENT: Negative for ear pain and sore throat.   Eyes: Negative for pain and visual disturbance.  Respiratory: Negative for cough and  shortness of breath.   Cardiovascular: Negative for chest pain and palpitations.  Gastrointestinal: Negative for abdominal pain and vomiting.  Genitourinary: Negative for dysuria and hematuria.  Musculoskeletal: Negative for arthralgias and back pain.  Skin: Negative for color change and rash.  Neurological: Positive for headaches. Negative for seizures and syncope.  All other systems reviewed and are negative.    Physical Exam Triage Vital Signs ED Triage Vitals  Enc Vitals Group     BP      Pulse      Resp      Temp      Temp src      SpO2      Weight      Height      Head Circumference      Peak Flow      Pain Score      Pain Loc      Pain Edu?      Excl. in GC?    No data found.  Updated Vital Signs LMP 03/08/2018   Visual Acuity Right Eye Distance:   Left Eye Distance:   Bilateral Distance:    Right Eye Near:   Left Eye Near:    Bilateral Near:     Physical Exam  Constitutional: She is oriented to person, place, and time. She appears well-developed and well-nourished.  HENT:  Head: Normocephalic.  Right Ear: External ear normal.  Left Ear: External ear normal.  Mouth/Throat: Oropharynx is clear and moist.  TM joint is nontender  Cardiovascular: Normal rate and regular rhythm.  Pulmonary/Chest: Effort normal and breath sounds normal.  Neurological: She is alert and oriented to person, place, and time. She displays normal reflexes. No cranial nerve deficit.  Psychiatric: She has a normal mood and affect. Her behavior is normal. Judgment and thought content normal.     UC Treatments / Results  Labs (all labs ordered are listed, but only abnormal results are displayed) Labs Reviewed - No data to display  EKG None  Radiology No results found.  Procedures Procedures (including critical care time)  Medications Ordered in UC Medications - No data to display  Initial Impression / Assessment and Plan / UC Course  I have reviewed the triage vital  signs and the nursing notes.  Pertinent labs & imaging results that were available during my care of the patient were reviewed by me and considered in my medical decision making (see chart for details).     Head pain of uncertain etiology since there are no accompanying symptoms will  treat with mild analgesic Final Clinical Impressions(s) / UC Diagnoses   Final diagnoses:  None   Discharge Instructions   None    ED Prescriptions    None     Controlled Substance Prescriptions [place] Controlled Substance Registry consulted? No   Frederica Kuster, MD 04/04/18 [phone]

## 2018-04-04 NOTE — ED Triage Notes (Signed)
See provider notes

## 2018-04-12 ENCOUNTER — Encounter: Payer: Self-pay | Admitting: *Deleted

## 2018-05-01 ENCOUNTER — Encounter (HOSPITAL_COMMUNITY): Payer: Self-pay | Admitting: Emergency Medicine

## 2018-05-01 ENCOUNTER — Ambulatory Visit (HOSPITAL_COMMUNITY)
Admission: EM | Admit: 2018-05-01 | Discharge: 2018-05-01 | Disposition: A | Discharge status: Home / Self Care | Payer: Self-pay | Attending: Family Medicine | Admitting: Family Medicine

## 2018-05-01 DIAGNOSIS — B9689 Other specified bacterial agents as the cause of diseases classified elsewhere: Secondary | ICD-10-CM

## 2018-05-01 DIAGNOSIS — N760 Acute vaginitis: Secondary | ICD-10-CM

## 2018-05-01 MED ORDER — METRONIDAZOLE 500 MG PO TABS: 500.0000 mg | ORAL_TABLET | Freq: Two times a day (BID) | ORAL | 0 refills | Status: AC

## 2018-05-01 NOTE — ED Provider Notes (Signed)
[facility]    CSN: [id number] Arrival date & time: 05/01/18  1213     History   Chief Complaint Chief Complaint  Patient presents with  . Vaginal Discharge    HPI [name] is a 32 y.o. female no significant past medical history presenting today for evaluation of BV.  Patient states that over the past week she has had worsening symptoms of BV.  Her main symptom is vaginal irritation.  Denies discharge or odor.  Patient was seen here recently and tested positive for BV, she states that she did not take the full course of the medicine.  She denies dysuria or increased frequency.  Patient was began spotting today and started her menstrual cycle.  Denies concerns for STDs, no new partners recently and has not been sexually active.  HPI  Past Medical History:  Diagnosis Date  . Headache   . 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  . Preterm labor     Patient Active Problem List   Diagnosis Date Noted  . Nipple infection in female 04/17/2017  . Active labor at term 12/12/2015  . Fetal demise 05/23/2014  . IUFD (intrauterine fetal death) May 23, 2014  . Abdominal pain in pregnancy   . [redacted] weeks gestation of pregnancy   . Active labor 12/06/2013  . SVD (spontaneous vaginal delivery) 12/06/2013  . Urethral lesion 05/08/2013  . 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 CERVICAL CERCLAGE    . CERVICAL CERCLAGE  03/30/2012   Procedure: CERCLAGE CERVICAL;  Surgeon: Sherron Monday, MD;  Location: [facility];  Service: Gynecology;  Laterality: N/A;  . CERVICAL CERCLAGE N/A 08/19/2013   Procedure: CERCLAGE CERVICAL;  Surgeon: Sherron Monday, MD;  Location: [facility];  Service: Gynecology;  Laterality: N/A;  . CERVICAL CERCLAGE N/A 06/17/2015   Procedure: CERCLAGE CERVICAL;  Surgeon: Sherian Rein, MD;  Location: [facility];  Service: Gynecology;  Laterality: N/A;  . CHOLECYSTECTOMY  2008   . DILATION AND CURETTAGE OF UTERUS    . DILATION AND EVACUATION  04/07/2012   Procedure: DILATATION AND EVACUATION;  Surgeon: Oliver Pila, MD;  Location: [facility];  Service: Gynecology;  Laterality: N/A;  . TUBAL LIGATION Bilateral 12/13/2015   Procedure: POST PARTUM TUBAL LIGATION;  Surgeon: Edwinna Areola, DO;  Location: [facility];  Service: Gynecology;  Laterality: Bilateral;    OB History    Gravida  8   Para  5   Term  4   Preterm      AB  3   Living  4     SAB  3   TAB      Ectopic      Multiple  0   Live Births  4            Home Medications    Prior to Admission medications   Medication Sig Start Date End Date Taking? Authorizing Provider  acetaminophen (TYLENOL) 325 MG tablet Take 650 mg by mouth every 6 (six) hours as needed.    [provider]  butalbital-acetaminophen-caffeine (FIORICET, ESGIC) 50-325-40 MG tablet Take 1-2 tablets by mouth every 6 (six) hours as needed for headache. 04/04/18 04/04/19  Frederica Kuster, MD  ibuprofen (ADVIL,MOTRIN) 800 MG tablet Take 1 tablet (800 mg total) by mouth 3 (three) times daily. 10/12/17   McDonald, Mia A, PA-C  metroNIDAZOLE (FLAGYL) 500 MG tablet Take 1 tablet (500 mg total)  by mouth 2 (two) times daily for 7 days. 05/01/18 05/08/18  Rucker Pridgeon C, PA-C  ondansetron (ZOFRAN ODT) 4 MG disintegrating tablet Take 1 tablet (4 mg total) every 8 (eight) hours as needed by mouth for nausea or vomiting. 04/24/17   Moss, Amber, DO  Prenatal Vit-Fe Fumarate-FA (PRENATAL MULTIVITAMIN) TABS tablet Take 1 tablet by mouth daily at 12 noon. 12/14/15   Bovard-StuckertAugusto Gamble, Jody, MD    Family History Family History  Problem Relation Age of Onset  . Diabetes Mother   . Hypertension Mother   . Kidney disease Mother   . Heart disease Mother     Social History Social History   Tobacco Use  . Smoking status: Former Smoker    Packs/day: 0.25    Years: 2.00    Pack years: 0.50    Types: Cigarettes     Last attempt to quit: 08/15/2010    Years since quitting: 7.7  . Smokeless tobacco: Never Used  Substance Use Topics  . Alcohol use: Yes    Comment: Social   . Drug use: No     Allergies   Patient has no known allergies.   Review of Systems Review of Systems  Constitutional: Negative for fever.  Respiratory: Negative for shortness of breath.   Cardiovascular: Negative for chest pain.  Gastrointestinal: Negative for abdominal pain, diarrhea, nausea and vomiting.  Genitourinary: Negative for dysuria, flank pain, genital sores, hematuria, menstrual problem, vaginal bleeding, vaginal discharge and vaginal pain.  Musculoskeletal: Negative for back pain.  Skin: Negative for rash.  Neurological: Negative for dizziness, light-headedness and headaches.     Physical Exam Triage Vital Signs ED Triage Vitals [05/01/18 1226]  Enc Vitals Group     BP (!) 147/64     Pulse Rate 80     Resp 18     Temp 98 F (36.7 C)     Temp Source Oral     SpO2 100 %     Weight      Height      Head Circumference      Peak Flow      Pain Score 0     Pain Loc      Pain Edu?      Excl. in GC?    No data found.  Updated Vital Signs BP (!) 147/64 (BP Location: Left Arm)   Pulse 80   Temp 98 F (36.7 C) (Oral)   Resp 18   SpO2 100%   Visual Acuity Right Eye Distance:   Left Eye Distance:   Bilateral Distance:    Right Eye Near:   Left Eye Near:    Bilateral Near:     Physical Exam  Constitutional: She is oriented to person, place, and time. She appears well-developed and well-nourished.  No acute distress  HENT:  Head: Normocephalic and atraumatic.  Nose: Nose normal.  Eyes: Conjunctivae are normal.  Neck: Neck supple.  Cardiovascular: Normal rate.  Pulmonary/Chest: Effort normal. No respiratory distress.  Abdominal: She exhibits no distension.  Musculoskeletal: Normal range of motion.  Neurological: She is alert and oriented to person, place, and time.  Skin: Skin is warm  and dry.  Psychiatric: She has a normal mood and affect.  Nursing note and vitals reviewed.    UC Treatments / Results  Labs (all labs ordered are listed, but only abnormal results are displayed) Labs Reviewed - No data to display  EKG None  Radiology No results found.  Procedures Procedures (including critical  care time)  Medications Ordered in UC Medications - No data to display  Initial Impression / Assessment and Plan / UC Course  I have reviewed the triage vital signs and the nursing notes.  Pertinent labs & imaging results that were available during my care of the patient were reviewed by me and considered in my medical decision making (see chart for details).     Patient with history of BV, similar symptoms today, will empirically treat for this with Flagyl twice daily x1 week.  Left patient follow-up if symptoms not resolving with treatment provided.  Testing not obtained given recurrent nature of this.  Discussed strict return precautions. Patient verbalized understanding and is agreeable with plan.  Final Clinical Impressions(s) / UC Diagnoses   Final diagnoses:  BV (bacterial vaginosis)     Discharge Instructions     Please begin metronidazole twice daily for 1 week. Do not drink alcohol until 24 hours after taking last tablet.Please take full course.  Follow up if not improving   ED Prescriptions    Medication Sig Dispense Auth. Provider   metroNIDAZOLE (FLAGYL) 500 MG tablet Take 1 tablet (500 mg total) by mouth 2 (two) times daily for 7 days. 14 tablet Vuk Skillern, [place] C, PA-C     Controlled Substance Prescriptions [place] Controlled Substance Registry consulted? Not Applicable   Lew Dawes, [place] 05/01/18 1303

## 2018-05-01 NOTE — Discharge Instructions (Signed)
Please begin metronidazole twice daily for 1 week. Do not drink alcohol until 24 hours after taking last tablet.Please take full course.  Follow up if not improving

## 2018-05-01 NOTE — ED Triage Notes (Signed)
Pt here requesting treatment for BV

## 2018-07-03 ENCOUNTER — Other Ambulatory Visit: Payer: Self-pay

## 2018-07-03 ENCOUNTER — Ambulatory Visit (HOSPITAL_COMMUNITY)
Admission: EM | Admit: 2018-07-03 | Discharge: 2018-07-03 | Disposition: A | Discharge status: Home / Self Care | Payer: Medicaid Other | Attending: Family Medicine | Admitting: Family Medicine

## 2018-07-03 DIAGNOSIS — K0889 Other specified disorders of teeth and supporting structures: Secondary | ICD-10-CM | POA: Insufficient documentation

## 2018-07-03 MED ORDER — AMOXICILLIN 875 MG PO TABS: 875.0000 mg | ORAL_TABLET | Freq: Two times a day (BID) | ORAL | 0 refills | Status: AC

## 2018-07-03 MED ORDER — IBUPROFEN 800 MG PO TABS: 800.0000 mg | ORAL_TABLET | Freq: Three times a day (TID) | ORAL | 0 refills | Status: DC

## 2018-07-03 NOTE — ED Triage Notes (Signed)
Dental pain 3 days ago.  Top, left area of mouth is painful

## 2018-07-04 NOTE — ED Provider Notes (Signed)
[facility]   [id number] 07/03/18 Arrival Time: 1805  ASSESSMENT & PLAN:  1. Pain, dental     No sign of dental abscess requiring I&D this evening. Discussed.  To start: Meds ordered this encounter  Medications  . amoxicillin (AMOXIL) 875 MG tablet    Sig: Take 1 tablet (875 mg total) by mouth 2 (two) times daily for 10 days.    Dispense:  20 tablet    Refill:  0  . ibuprofen (ADVIL,MOTRIN) 800 MG tablet    Sig: Take 1 tablet (800 mg total) by mouth 3 (three) times daily.    Dispense:  21 tablet    Refill:  0   Dental resource written instructions given. She will schedule dental evaluation as soon as possible. She will have dental insurance beginning in Feb 2019.  Reviewed expectations re: course of current medical issues. Questions answered. Outlined signs and symptoms indicating need for more acute intervention. Patient verbalized understanding. After Visit Summary given.   SUBJECTIVE:  [name] is a 33 y.o. female who reports gradual onset of left upper rear molar dental pain described as aching and throbbing. Present for approx 1 week, worse the past 3 days. Fever: absent. Tolerating PO intake but reports pain with chewing. Normal swallowing. She does not see a dentist regularly. No neck swelling or pain. OTC analgesics without relief.  ROS: As per HPI. All other systems negative   OBJECTIVE: Vitals:   07/03/18 1907  BP: 116/63  Pulse: 74  Resp: 18  Temp: 97.7 F (36.5 C)  TempSrc: Temporal  SpO2: 100%    General appearance: alert; no distress HENT: normocephalic; atraumatic; dentition: fair; over left upper rear molar (deteriorated) the gum is erythematous and tender; without areas of fluctuance, drainage, or bleeding; normal jaw movement without difficulty Neck: supple without LAD; FROM; trachea midline Lungs: normal respirations; unlabored Skin: warm and dry Psychological: alert and cooperative; normal mood and affect  No Known  Allergies  Past Medical History:  Diagnosis Date  . Headache   . 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  . Preterm labor    Social History   Socioeconomic History  . Marital status: Single    Spouse name: Not on file  . Number of children: Not on file  . Years of education: Not on file  . Highest education level: Not on file  Occupational History  . Not on file  Social Needs  . Financial resource strain: Not on file  . Food insecurity:    Worry: Not on file    Inability: Not on file  . Transportation needs:    Medical: Not on file    Non-medical: Not on file  Tobacco Use  . Smoking status: Former Smoker    Packs/day: 0.25    Years: 2.00    Pack years: 0.50    Types: Cigarettes    Last attempt to quit: 08/15/2010    Years since quitting: 7.8  . Smokeless tobacco: Never Used  Substance and Sexual Activity  . Alcohol use: Yes    Comment: Social   . Drug use: No  . Sexual activity: Yes    Birth control/protection: Implant  Lifestyle  . Physical activity:    Days per week: Not on file    Minutes per session: Not on file  . Stress: Not on file  Relationships  . Social connections:    Talks on phone: Not on file    Gets together: Not  on file    Attends religious service: Not on file    Active member of club or organization: Not on file    Attends meetings of clubs or organizations: Not on file    Relationship status: Not on file  . Intimate partner violence:    Fear of current or ex partner: Not on file    Emotionally abused: Not on file    Physically abused: Not on file    Forced sexual activity: Not on file  Other Topics Concern  . Not on file  Social History Narrative  . Not on file   Family History  Problem Relation Age of Onset  . Diabetes Mother   . Hypertension Mother   . Kidney disease Mother   . Heart disease Mother    Past Surgical History:  Procedure Laterality Date  . CERVICAL CERCLAGE    . CERVICAL CERCLAGE   03/30/2012   Procedure: CERCLAGE CERVICAL;  Surgeon: Sherron MondayJody Bovard, MD;  Location: [facility];  Service: Gynecology;  Laterality: N/A;  . CERVICAL CERCLAGE N/A 08/19/2013   Procedure: CERCLAGE CERVICAL;  Surgeon: Sherron MondayJody Bovard, MD;  Location: [facility];  Service: Gynecology;  Laterality: N/A;  . CERVICAL CERCLAGE N/A 06/17/2015   Procedure: CERCLAGE CERVICAL;  Surgeon: Sherian ReinJody [name], MD;  Location: [facility];  Service: Gynecology;  Laterality: N/A;  . CHOLECYSTECTOMY  2008  . DILATION AND CURETTAGE OF UTERUS    . DILATION AND EVACUATION  04/07/2012   Procedure: DILATATION AND EVACUATION;  Surgeon: Oliver PilaKathy W Richardson, MD;  Location: [facility];  Service: Gynecology;  Laterality: N/A;  . TUBAL LIGATION Bilateral 12/13/2015   Procedure: POST PARTUM TUBAL LIGATION;  Surgeon: Edwinna Areolaecilia Worema Banga, DO;  Location: [facility];  Service: Gynecology;  Laterality: Bilateral;     Mardella LaymanHagler, Cordera Stineman, MD 07/04/18 [id number]

## 2019-08-11 ENCOUNTER — Encounter (HOSPITAL_COMMUNITY): Payer: Self-pay | Admitting: Emergency Medicine

## 2019-08-11 ENCOUNTER — Ambulatory Visit (HOSPITAL_COMMUNITY)
Admission: EM | Admit: 2019-08-11 | Discharge: 2019-08-11 | Disposition: A | Discharge status: Home / Self Care | Payer: Medicaid Other | Attending: Family Medicine | Admitting: Family Medicine

## 2019-08-11 ENCOUNTER — Other Ambulatory Visit: Payer: Self-pay

## 2019-08-11 DIAGNOSIS — L0231 Cutaneous abscess of buttock: Secondary | ICD-10-CM

## 2019-08-11 MED ORDER — DOXYCYCLINE HYCLATE 100 MG PO CAPS: 100.0000 mg | ORAL_CAPSULE | Freq: Two times a day (BID) | ORAL | 0 refills | Status: DC

## 2019-08-11 MED ORDER — FLUCONAZOLE 200 MG PO TABS: 200.0000 mg | ORAL_TABLET | Freq: Once | ORAL | 0 refills | Status: AC

## 2019-08-11 NOTE — ED Triage Notes (Signed)
Pt reports an abscess to her coccyx that she first noticed on Thursday.

## 2019-08-11 NOTE — ED Provider Notes (Signed)
[facility]    CSN: [id number] Arrival date & time: 08/11/19  1721      History   Chief Complaint Chief Complaint  Patient presents with  . Recurrent Skin Infections    HPI [name] is a 34 y.o. female.   Reports that she has a boil to R buttock. Reports that it has been there for about 5 days. Reports pain with sitting. Reports that she has a history of boils. Denies any attempts to treat at home. Denies, fever, chills, HA, ST, SOB, myalgias, rash, other symptoms.  ROS per HPI  The history is provided by the patient.    Past Medical History:  Diagnosis Date  . Headache   . 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  . Preterm labor     Patient Active Problem List   Diagnosis Date Noted  . Nipple infection in female 04/17/2017  . Active labor at term 12/12/2015  . Fetal demise 29-May-2014  . IUFD (intrauterine fetal death) May 29, 2014  . Abdominal pain in pregnancy   . [redacted] weeks gestation of pregnancy   . Active labor 12/06/2013  . SVD (spontaneous vaginal delivery) 12/06/2013  . Urethral lesion 05/08/2013  . 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 CERVICAL CERCLAGE    . CERVICAL CERCLAGE  03/30/2012   Procedure: CERCLAGE CERVICAL;  Surgeon: Sherron Monday, MD;  Location: [facility];  Service: Gynecology;  Laterality: N/A;  . CERVICAL CERCLAGE N/A 08/19/2013   Procedure: CERCLAGE CERVICAL;  Surgeon: Sherron Monday, MD;  Location: [facility];  Service: Gynecology;  Laterality: N/A;  . CERVICAL CERCLAGE N/A 06/17/2015   Procedure: CERCLAGE CERVICAL;  Surgeon: Sherian Rein, MD;  Location: [facility];  Service: Gynecology;  Laterality: N/A;  . CHOLECYSTECTOMY  2008  . DILATION AND CURETTAGE OF UTERUS    . DILATION AND EVACUATION  04/07/2012   Procedure: DILATATION AND EVACUATION;  Surgeon: Oliver Pila, MD;  Location: [facility];  Service: Gynecology;  Laterality:  N/A;  . TUBAL LIGATION Bilateral 12/13/2015   Procedure: POST PARTUM TUBAL LIGATION;  Surgeon: Edwinna Areola, DO;  Location: [facility];  Service: Gynecology;  Laterality: Bilateral;    OB History    Gravida  8   Para  5   Term  4   Preterm      AB  3   Living  4     SAB  3   TAB      Ectopic      Multiple  0   Live Births  4            Home Medications    Prior to Admission medications   Medication Sig Start Date End Date Taking? Authorizing Provider  acetaminophen (TYLENOL) 325 MG tablet Take 650 mg by mouth every 6 (six) hours as needed.    [provider]  doxycycline (VIBRAMYCIN) 100 MG capsule Take 1 capsule (100 mg total) by mouth 2 (two) times daily. 08/11/19   Moshe Cipro, NP  ibuprofen (ADVIL,MOTRIN) 800 MG tablet Take 1 tablet (800 mg total) by mouth 3 (three) times daily. 07/03/18   Mardella Layman, MD  ondansetron (ZOFRAN ODT) 4 MG disintegrating tablet Take 1 tablet (4 mg total) every 8 (eight) hours as needed by mouth for nausea or vomiting. 04/24/17   Moss, Amber, DO  Prenatal Vit-Fe Fumarate-FA (PRENATAL MULTIVITAMIN) TABS tablet Take 1 tablet by mouth daily at  12 noon. 12/14/15   Bovard-StuckertJeral Fruit, MD    Family History Family History  Problem Relation Age of Onset  . Diabetes Mother   . Hypertension Mother   . Kidney disease Mother   . Heart disease Mother     Social History Social History   Tobacco Use  . Smoking status: Former Smoker    Packs/day: 0.25    Years: 2.00    Pack years: 0.50    Types: Cigarettes    Quit date: 08/15/2010    Years since quitting: 8.9  . Smokeless tobacco: Never Used  Substance Use Topics  . Alcohol use: Yes    Comment: Social   . Drug use: No     Allergies   Patient has no known allergies.   Review of Systems Review of Systems   Physical Exam Triage Vital Signs ED Triage Vitals  Enc Vitals Group     BP 08/11/19 1748 (!) 126/59     Pulse Rate 08/11/19 1748 76     Resp  08/11/19 1748 16     Temp 08/11/19 1748 98.8 F (37.1 C)     Temp Source 08/11/19 1748 Oral     SpO2 08/11/19 1748 98 %     Weight --      Height --      Head Circumference --      Peak Flow --      Pain Score 08/11/19 1751 9     Pain Loc --      Pain Edu? --      Excl. in [place]? --    No data found.  Updated Vital Signs BP (!) 126/59 (BP Location: Right Arm)   Pulse 76   Temp 98.8 F (37.1 C) (Oral)   Resp 16   LMP 07/26/2019 (Approximate)   SpO2 98%   Breastfeeding No     Physical Exam Vitals and nursing note reviewed.  Constitutional:      General: She is not in acute distress.    Appearance: Normal appearance. She is well-developed. She is obese.  HENT:     Head: Normocephalic and atraumatic.  Eyes:     Conjunctiva/sclera: Conjunctivae normal.  Cardiovascular:     Rate and Rhythm: Normal rate and regular rhythm.     Heart sounds: Normal heart sounds. No murmur.  Pulmonary:     Effort: Pulmonary effort is normal. No respiratory distress.     Breath sounds: Normal breath sounds. No stridor. No wheezing, rhonchi or rales.  Abdominal:     General: Bowel sounds are normal. There is no distension.     Palpations: Abdomen is soft. There is no mass.     Tenderness: There is no abdominal tenderness. There is no guarding or rebound.     Hernia: No hernia is present.  Musculoskeletal:        General: Normal range of motion.     Cervical back: Neck supple.  Skin:    General: Skin is warm and dry.     Findings: Lesion present.          Comments: Abscess location  Neurological:     General: No focal deficit present.     Mental Status: She is alert and oriented to person, place, and time.  Psychiatric:        Mood and Affect: Mood normal.        Behavior: Behavior normal.      UC Treatments / Results  Labs (all labs ordered are  listed, but only abnormal results are displayed) Labs Reviewed - No data to display  EKG   Radiology No results  found.  Procedures Procedures (including critical care time)  Medications Ordered in UC Medications - No data to display  Initial Impression / Assessment and Plan / UC Course  I have reviewed the triage vital signs and the nursing notes.  Pertinent labs & imaging results that were available during my care of the patient were reviewed by me and considered in my medical decision making (see chart for details).     Abscess to R buttock. Skin thickening around area, 3x3cm in diameter. Sent in Doxycycline 100mg  BID x 7 days and fluconazole as she reports that she is prone to yeast infection. Instructed to follow up with primary care as needed, or to UC office.  Final Clinical Impressions(s) / UC Diagnoses   Final diagnoses:  Cutaneous abscess of buttock     Discharge Instructions     Warm compresses 3-4x daily for 10-15 minutes.  You may then wash site daily with warm water and mild soap Keep covered to avoid friction  Take antibiotic as prescribed and to completion  Return sooner or go to the ED if you have any new or worsening symptoms such as increased redness, swelling, pain, nausea, vomiting, fever, chills, etc...     ED Prescriptions    Medication Sig Dispense Auth. Provider   doxycycline (VIBRAMYCIN) 100 MG capsule Take 1 capsule (100 mg total) by mouth 2 (two) times daily. 14 capsule , NP   fluconazole (DIFLUCAN) 200 MG tablet Take 1 tablet (200 mg total) by mouth once for 1 dose. 2 tablet Moshe Cipro, NP     I have reviewed the PDMP during this encounter.   Moshe Cipro, NP 08/12/19 1155

## 2019-08-11 NOTE — Discharge Instructions (Signed)
Warm compresses 3-4x daily for 10-15 minutes.  You may then wash site daily with warm water and mild soap Keep covered to avoid friction  Take antibiotic as prescribed and to completion  Return sooner or go to the ED if you have any new or worsening symptoms such as increased redness, swelling, pain, nausea, vomiting, fever, chills, etc...  

## 2021-11-03 ENCOUNTER — Ambulatory Visit
Admission: EM | Admit: 2021-11-03 | Discharge: 2021-11-03 | Disposition: A | Discharge status: Home / Self Care | Payer: Medicaid Other | Attending: Urgent Care | Admitting: Urgent Care

## 2021-11-03 DIAGNOSIS — S39012A Strain of muscle, fascia and tendon of lower back, initial encounter: Secondary | ICD-10-CM

## 2021-11-03 DIAGNOSIS — M545 Low back pain, unspecified: Secondary | ICD-10-CM | POA: Diagnosis not present

## 2021-11-03 MED ORDER — TIZANIDINE HCL 4 MG PO TABS: 4.0000 mg | ORAL_TABLET | Freq: Every day | ORAL | 0 refills | Status: DC

## 2021-11-03 MED ORDER — NAPROXEN 500 MG PO TABS: 500.0000 mg | ORAL_TABLET | Freq: Two times a day (BID) | ORAL | 0 refills | Status: DC

## 2021-11-03 NOTE — ED Provider Notes (Signed)
?[facility] ? ? ?MRN: [id number] DOB: 1986/01/23 ? ?Subjective:  ? ?[name] is a 36 y.o. female presenting for 1 day history of moderate to severe low back pain.  Patient works 2 jobs both of which require a lot of standing, bending but yesterday she had to do a lot of lifting.  Does not hydrate well at all.  Denies fall, trauma, weakness, numbness or tingling, changes to bowel or urinary habits.  Has tried Tylenol with some relief. ? ?No current facility-administered medications for this encounter. ? ?Current Outpatient Medications:  ?  acetaminophen (TYLENOL) 325 MG tablet, Take 650 mg by mouth every 6 (six) hours as needed., Disp: , Rfl:  ?  doxycycline (VIBRAMYCIN) 100 MG capsule, Take 1 capsule (100 mg total) by mouth 2 (two) times daily., Disp: 14 capsule, Rfl: 0 ?  ibuprofen (ADVIL,MOTRIN) 800 MG tablet, Take 1 tablet (800 mg total) by mouth 3 (three) times daily., Disp: 21 tablet, Rfl: 0 ?  ondansetron (ZOFRAN ODT) 4 MG disintegrating tablet, Take 1 tablet (4 mg total) every 8 (eight) hours as needed by mouth for nausea or vomiting., Disp: 20 tablet, Rfl: 0 ?  Prenatal Vit-Fe Fumarate-FA (PRENATAL MULTIVITAMIN) TABS tablet, Take 1 tablet by mouth daily at 12 noon., Disp: 100 tablet, Rfl: 3  ? ?No Known Allergies ? ?Past Medical History:  ?Diagnosis Date  ? Headache   ? Incompetent cervix in pregnancy 03/30/2012  ? cerclage with last preg.  ? Preterm labor   ?  ? ?Past Surgical History:  ?Procedure Laterality Date  ? CERVICAL CERCLAGE    ? CERVICAL CERCLAGE  03/30/2012  ? Procedure: CERCLAGE CERVICAL;  Surgeon: Sherron Monday, MD;  Location: [facility];  Service: Gynecology;  Laterality: N/A;  ? CERVICAL CERCLAGE N/A 08/19/2013  ? Procedure: CERCLAGE CERVICAL;  Surgeon: Sherron Monday, MD;  Location: [facility];  Service: Gynecology;  Laterality: N/A;  ? CERVICAL CERCLAGE N/A 06/17/2015  ? Procedure: CERCLAGE CERVICAL;  Surgeon: Sherian Rein, MD;  Location: [facility];  Service:  Gynecology;  Laterality: N/A;  ? CHOLECYSTECTOMY  2008  ? DILATION AND CURETTAGE OF UTERUS    ? DILATION AND EVACUATION  04/07/2012  ? Procedure: DILATATION AND EVACUATION;  Surgeon: Oliver Pila, MD;  Location: [facility];  Service: Gynecology;  Laterality: N/A;  ? TUBAL LIGATION Bilateral 12/13/2015  ? Procedure: POST PARTUM TUBAL LIGATION;  Surgeon: Edwinna Areola, DO;  Location: [facility];  Service: Gynecology;  Laterality: Bilateral;  ? ? ?Family History  ?Problem Relation Age of Onset  ? Diabetes Mother   ? Hypertension Mother   ? Kidney disease Mother   ? Heart disease Mother   ? ? ?Social History  ? ?Tobacco Use  ? Smoking status: Former  ?  Packs/day: 0.25  ?  Years: 2.00  ?  Pack years: 0.50  ?  Types: Cigarettes  ?  Quit date: 08/15/2010  ?  Years since quitting: 11.2  ? Smokeless tobacco: Never  ?Substance Use Topics  ? Alcohol use: Yes  ?  Comment: Social   ? Drug use: No  ? ? ?ROS ? ? ?Objective:  ? ?Vitals: ?BP 115/77   Pulse 78   Temp 98.4 ?F (36.9 ?C)   Resp 19   LMP 10/31/2021   SpO2 98%  ? ?Physical Exam ?Constitutional:   ?   General: She is not in acute distress. ?   Appearance: Normal appearance. She is well-developed. She is not ill-appearing, toxic-appearing or  diaphoretic.  ?HENT:  ?   Head: Normocephalic and atraumatic.  ?   Nose: Nose normal.  ?   Mouth/Throat:  ?   Mouth: Mucous membranes are moist.  ?Eyes:  ?   General: No scleral icterus.    ?   Right eye: No discharge.     ?   Left eye: No discharge.  ?   Extraocular Movements: Extraocular movements intact.  ?Cardiovascular:  ?   Rate and Rhythm: Normal rate.  ?Pulmonary:  ?   Effort: Pulmonary effort is normal.  ?Musculoskeletal:  ?   Lumbar back: Tenderness (along area outlined) present. No swelling, edema, deformity, signs of trauma, lacerations, spasms or bony tenderness. Normal range of motion. Negative right straight leg raise test and negative left straight leg raise test. No scoliosis.  ?     Back: ? ?Skin: ?   General:  Skin is warm and dry.  ?Neurological:  ?   General: No focal deficit present.  ?   Mental Status: She is alert and oriented to person, place, and time.  ?Psychiatric:     ?   Mood and Affect: Mood normal.     ?   Behavior: Behavior normal.  ? ? ?Assessment and Plan :  ? ?PDMP not reviewed this encounter. ? ?1. Acute midline low back pain without sciatica   ?2. Lumbar strain, initial encounter   ? ?Given lack of trauma or neuropathic symptoms and an excellent physical exam recommended deferring imaging for now. Will manage conservatively for back strain with NSAID and muscle relaxant, rest and modification of physical activity.  Anticipatory guidance provided.  Counseled patient on potential for adverse effects with medications prescribed/recommended today, ER and return-to-clinic precautions discussed, patient verbalized understanding. ? ?  ?Wallis Bamberg, PA-C ?11/03/21 1631 ? ?

## 2021-11-03 NOTE — ED Triage Notes (Addendum)
Pt presents with complaints of lower back pain that started last night. Pt denies any injury. Pain is worse with movement. Denies urinary symptoms.  Reports relief with tylenol. ?

## 2022-06-12 ENCOUNTER — Other Ambulatory Visit: Payer: Self-pay

## 2022-06-12 ENCOUNTER — Encounter: Payer: Self-pay | Admitting: Emergency Medicine

## 2022-06-12 ENCOUNTER — Ambulatory Visit
Admission: EM | Admit: 2022-06-12 | Discharge: 2022-06-12 | Disposition: A | Discharge status: Home / Self Care | Payer: Medicaid Other | Attending: Urgent Care | Admitting: Urgent Care

## 2022-06-12 DIAGNOSIS — H65193 Other acute nonsuppurative otitis media, bilateral: Secondary | ICD-10-CM

## 2022-06-12 DIAGNOSIS — J069 Acute upper respiratory infection, unspecified: Secondary | ICD-10-CM

## 2022-06-12 MED ORDER — AMOXICILLIN 875 MG PO TABS: 875.0000 mg | ORAL_TABLET | Freq: Two times a day (BID) | ORAL | 0 refills | Status: DC

## 2022-06-12 MED ORDER — PSEUDOEPHEDRINE HCL 60 MG PO TABS: 60.0000 mg | ORAL_TABLET | Freq: Three times a day (TID) | ORAL | 0 refills | Status: DC | PRN

## 2022-06-12 MED ORDER — CETIRIZINE HCL 10 MG PO TABS: 10.0000 mg | ORAL_TABLET | Freq: Every day | ORAL | 0 refills | Status: DC

## 2022-06-12 NOTE — ED Triage Notes (Signed)
Pt here for left sided ear pain x 2 days

## 2022-06-12 NOTE — ED Provider Notes (Signed)
[facility]  Note:  This document was prepared using Conservation officer, historic buildings and may include unintentional dictation errors.  MRN: [id number] DOB: 1986/06/03  Subjective:   [name] is a 36 y.o. female presenting for 3-day history of acute onset persistent sinus congestion, runny nose, cold symptoms.  She has not developed severe left-sided constant ear pain.  No chest pain, shortness of breath or wheezing.  No current facility-administered medications for this encounter.  Current Outpatient Medications:    acetaminophen (TYLENOL) 325 MG tablet, Take 650 mg by mouth every 6 (six) hours as needed., Disp: , Rfl:    doxycycline (VIBRAMYCIN) 100 MG capsule, Take 1 capsule (100 mg total) by mouth 2 (two) times daily. (Patient not taking: Reported on 06/12/2022), Disp: 14 capsule, Rfl: 0   ibuprofen (ADVIL,MOTRIN) 800 MG tablet, Take 1 tablet (800 mg total) by mouth 3 (three) times daily., Disp: 21 tablet, Rfl: 0   naproxen (NAPROSYN) 500 MG tablet, Take 1 tablet (500 mg total) by mouth 2 (two) times daily with a meal., Disp: 30 tablet, Rfl: 0   ondansetron (ZOFRAN ODT) 4 MG disintegrating tablet, Take 1 tablet (4 mg total) every 8 (eight) hours as needed by mouth for nausea or vomiting., Disp: 20 tablet, Rfl: 0   Prenatal Vit-Fe Fumarate-FA (PRENATAL MULTIVITAMIN) TABS tablet, Take 1 tablet by mouth daily at 12 noon., Disp: 100 tablet, Rfl: 3   tiZANidine (ZANAFLEX) 4 MG tablet, Take 1 tablet (4 mg total) by mouth at bedtime., Disp: 30 tablet, Rfl: 0   No Known Allergies  Past Medical History:  Diagnosis Date   Headache    Incompetent cervix in pregnancy 03/30/2012   cerclage with last preg.   Preterm labor      Past Surgical History:  Procedure Laterality Date   CERVICAL CERCLAGE     CERVICAL CERCLAGE  03/30/2012   Procedure: CERCLAGE CERVICAL;  Surgeon: Sherron Monday, MD;  Location: [facility];  Service: Gynecology;  Laterality: N/A;    CERVICAL CERCLAGE N/A 08/19/2013   Procedure: CERCLAGE CERVICAL;  Surgeon: Sherron Monday, MD;  Location: [facility];  Service: Gynecology;  Laterality: N/A;   CERVICAL CERCLAGE N/A 06/17/2015   Procedure: CERCLAGE CERVICAL;  Surgeon: Sherian Rein, MD;  Location: [facility];  Service: Gynecology;  Laterality: N/A;   CHOLECYSTECTOMY  2008   DILATION AND CURETTAGE OF UTERUS     DILATION AND EVACUATION  04/07/2012   Procedure: DILATATION AND EVACUATION;  Surgeon: Oliver Pila, MD;  Location: [facility];  Service: Gynecology;  Laterality: N/A;   TUBAL LIGATION Bilateral 12/13/2015   Procedure: POST PARTUM TUBAL LIGATION;  Surgeon: Edwinna Areola, DO;  Location: [facility];  Service: Gynecology;  Laterality: Bilateral;    Family History  Problem Relation Age of Onset   Diabetes Mother    Hypertension Mother    Kidney disease Mother    Heart disease Mother     Social History   Tobacco Use   Smoking status: Former    Packs/day: 0.25    Years: 2.00    Total pack years: 0.50    Types: Cigarettes    Quit date: 08/15/2010    Years since quitting: 11.8   Smokeless tobacco: Never  Substance Use Topics   Alcohol use: Yes    Comment: Social    Drug use: No    ROS   Objective:   Vitals: BP 127/77 (BP Location: Right Arm)   Pulse 82   Temp 98.5 F (  36.9 C) (Oral)   Resp 18   SpO2 95%   Physical Exam Constitutional:      General: She is not in acute distress.    Appearance: Normal appearance. She is well-developed and normal weight. She is not ill-appearing, toxic-appearing or diaphoretic.  HENT:     Head: Normocephalic and atraumatic.     Right Ear: Ear canal and external ear normal. No drainage or tenderness. No middle ear effusion. There is no impacted cerumen. Tympanic membrane is erythematous and bulging.     Left Ear: Ear canal and external ear normal. No drainage or tenderness.  No middle ear effusion. There is no impacted cerumen. Tympanic membrane is erythematous.  Tympanic membrane is not bulging.     Nose: Nose normal. No congestion or rhinorrhea.     Mouth/Throat:     Mouth: Mucous membranes are moist. No oral lesions.     Pharynx: No pharyngeal swelling, oropharyngeal exudate, posterior oropharyngeal erythema or uvula swelling.     Tonsils: No tonsillar exudate or tonsillar abscesses.  Eyes:     General: No scleral icterus.       Right eye: No discharge.        Left eye: No discharge.     Extraocular Movements: Extraocular movements intact.     Right eye: Normal extraocular motion.     Left eye: Normal extraocular motion.     Conjunctiva/sclera: Conjunctivae normal.  Cardiovascular:     Rate and Rhythm: Normal rate and regular rhythm.     Heart sounds: Normal heart sounds. No murmur heard.    No friction rub. No gallop.  Pulmonary:     Effort: Pulmonary effort is normal. No respiratory distress.     Breath sounds: No stridor. No wheezing, rhonchi or rales.  Chest:     Chest wall: No tenderness.  Musculoskeletal:     Cervical back: Normal range of motion and neck supple.  Lymphadenopathy:     Cervical: No cervical adenopathy.  Skin:    General: Skin is warm and dry.  Neurological:     General: No focal deficit present.     Mental Status: She is alert and oriented to person, place, and time.  Psychiatric:        Mood and Affect: Mood normal.        Behavior: Behavior normal.     Assessment and Plan :   PDMP not reviewed this encounter.  1. Other non-recurrent acute nonsuppurative otitis media of both ears   2. Viral upper respiratory infection     Recommended covering for an otitis media with amoxicillin and likely secondary to a viral respiratory infection.  Otherwise, physical exam findings reassuring and vital signs stable for discharge. Advised supportive care, offered symptomatic relief. Counseled patient on potential for adverse effects with medications prescribed/recommended today, ER and return-to-clinic precautions  discussed, patient verbalized understanding.     Wallis Bamberg, PA-C 06/12/22 1422

## 2022-06-13 ENCOUNTER — Emergency Department (HOSPITAL_BASED_OUTPATIENT_CLINIC_OR_DEPARTMENT_OTHER)
Admission: EM | Admit: 2022-06-13 | Discharge: 2022-06-13 | Disposition: A | Discharge status: Home / Self Care | Payer: Medicaid Other | Attending: Emergency Medicine | Admitting: Emergency Medicine

## 2022-06-13 ENCOUNTER — Other Ambulatory Visit: Payer: Self-pay

## 2022-06-13 ENCOUNTER — Encounter (HOSPITAL_BASED_OUTPATIENT_CLINIC_OR_DEPARTMENT_OTHER): Payer: Self-pay

## 2022-06-13 DIAGNOSIS — H9202 Otalgia, left ear: Secondary | ICD-10-CM | POA: Diagnosis present

## 2022-06-13 DIAGNOSIS — H65192 Other acute nonsuppurative otitis media, left ear: Secondary | ICD-10-CM | POA: Diagnosis not present

## 2022-06-13 MED ORDER — KETOROLAC TROMETHAMINE 60 MG/2ML IM SOLN
30.0000 mg | Freq: Once | INTRAMUSCULAR | Status: AC
  Administered 2022-06-13: 30 mg via INTRAMUSCULAR
  Filled 2022-06-13: qty 2

## 2022-06-13 MED ORDER — IBUPROFEN 800 MG PO TABS: 800.0000 mg | ORAL_TABLET | Freq: Three times a day (TID) | ORAL | 0 refills | Status: DC

## 2022-06-13 MED ORDER — DEXAMETHASONE SODIUM PHOSPHATE 10 MG/ML IJ SOLN
10.0000 mg | Freq: Once | INTRAMUSCULAR | Status: AC
  Administered 2022-06-13: 10 mg via INTRAMUSCULAR
  Filled 2022-06-13: qty 1

## 2022-06-13 NOTE — ED Provider Notes (Signed)
[facility] EMERGENCY DEPT Provider Note   CSN: [id number] Arrival date & time: 06/13/22  1854     History Chief Complaint  Patient presents with   Otalgia    [name] is a 36 y.o. female.   Otalgia Associated symptoms: no abdominal pain, no cough, no ear discharge, no fever and no rhinorrhea   Patient presents emergency department concerns of significant left ear pain following diagnosis of acute otitis media.  She was given a prescription for amoxicillin which she has been taking but she states that pain is severe and left ear and is uncomfortable to eat.  She reports that has been taking Tylenol for pain relief with minimal improvement in symptoms.  Denies fever, headache, difficulty swallowing, chest pain, shortness of breath, mastoid tenderness.     Home Medications Prior to Admission medications   Medication Sig Start Date End Date Taking? Authorizing Provider  acetaminophen (TYLENOL) 325 MG tablet Take 650 mg by mouth every 6 (six) hours as needed.    [provider]  amoxicillin (AMOXIL) 875 MG tablet Take 1 tablet (875 mg total) by mouth 2 (two) times daily. 06/12/22   Wallis Bamberg, PA-C  cetirizine (ZYRTEC ALLERGY) 10 MG tablet Take 1 tablet (10 mg total) by mouth daily. 06/12/22   Wallis Bamberg, PA-C  ibuprofen (ADVIL,MOTRIN) 800 MG tablet Take 1 tablet (800 mg total) by mouth 3 (three) times daily. 07/03/18   Mardella Layman, MD  naproxen (NAPROSYN) 500 MG tablet Take 1 tablet (500 mg total) by mouth 2 (two) times daily with a meal. 11/03/21   Wallis Bamberg, PA-C  ondansetron (ZOFRAN ODT) 4 MG disintegrating tablet Take 1 tablet (4 mg total) every 8 (eight) hours as needed by mouth for nausea or vomiting. 04/24/17   Moss, Amber, DO  Prenatal Vit-Fe Fumarate-FA (PRENATAL MULTIVITAMIN) TABS tablet Take 1 tablet by mouth daily at 12 noon. 12/14/15   [name], Augusto Gamble, MD  pseudoephedrine (SUDAFED) 60 MG tablet Take 1 tablet (60 mg total) by mouth  every 8 (eight) hours as needed for congestion. 06/12/22   Wallis Bamberg, PA-C  tiZANidine (ZANAFLEX) 4 MG tablet Take 1 tablet (4 mg total) by mouth at bedtime. 11/03/21   Wallis Bamberg, PA-C      Allergies    Patient has no known allergies.    Review of Systems   Review of Systems  Constitutional:  Negative for fever.  HENT:  Positive for ear pain. Negative for ear discharge, facial swelling, rhinorrhea, sinus pressure and sinus pain.   Respiratory:  Negative for cough, chest tightness and shortness of breath.   Cardiovascular:  Negative for chest pain.  Gastrointestinal:  Negative for abdominal pain.  All other systems reviewed and are negative.   Physical Exam Updated Vital Signs BP 130/84   Pulse 85   Temp 98.6 F (37 C) (Oral)   Resp 18   Ht 5\' 8"  (1.727 m)   Wt 104.3 kg   LMP 05/17/2022   SpO2 100%   BMI 34.97 kg/m  Physical Exam Vitals and nursing note reviewed.  Constitutional:      Appearance: Normal appearance.  HENT:     Head: Normocephalic and atraumatic.  Eyes:     Conjunctiva/sclera: Conjunctivae normal.  Cardiovascular:     Rate and Rhythm: Normal rate and regular rhythm.  Pulmonary:     Effort: Pulmonary effort is normal.     Breath sounds: Normal breath sounds.  Skin:    General: Skin is warm and dry.  Capillary Refill: Capillary refill takes less than 2 seconds.  Neurological:     General: No focal deficit present.     Mental Status: She is alert.     ED Results / Procedures / Treatments   Labs (all labs ordered are listed, but only abnormal results are displayed) Labs Reviewed - No data to display  EKG None  Radiology No results found.  Procedures Procedures   Medications Ordered in ED Medications  dexamethasone (DECADRON) injection 10 mg (10 mg Intramuscular Given 06/13/22 2054)  ketorolac (TORADOL) injection 30 mg (30 mg Intramuscular Given 06/13/22 2054)    ED Course/ Medical Decision Making/ A&P                            Medical Decision Making  This patient presents to the ED for concern of ear pain.  Differential diagnosis includes otitis media, otitis externa, mastoiditis, headache, migraine headache    Additional history obtained:  Additional history obtained from urgent care visit from 06/12/2022 in which patient was diagnosed with otitis media and prescribed amoxicillin   Medicines ordered and prescription drug management:  I ordered medication including Toradol and Decadron for otalgia Reevaluation of the patient after these medicines showed that the patient improved I have reviewed the patients home medicines and have made adjustments as needed   Problem List / ED Course:  Patient was evaluated in the emergency department for concerns of severe left-sided otalgia.  Patient was prescribed amoxicillin yesterday for this ear infection which she has been taking as prescribed and only been taking Tylenol for pain relief with minimal improvement.  Patient denies any hearing loss or posterior pain.  Based on exam it appears the patient is in significant discomfort and goal of visit was pain control.  Strongly indicated doses of Toradol and Decadron were administered to help improve pain and left hip pain.  Final Clinical Impression(s) / ED Diagnoses Final diagnoses:  Other non-recurrent acute nonsuppurative otitis media of left ear    Rx / DC Orders ED Discharge Orders     None         Vladimir Creeks 06/13/22 2118    Deno Etienne, DO 06/13/22 2219

## 2022-06-13 NOTE — ED Triage Notes (Signed)
Pt states left ear pain. Pt states she was seen yesterday and given amoxicillin but is still having pain.

## 2022-06-13 NOTE — Discharge Instructions (Signed)
You were seen in the emergency department for left ear pain from an acute otitis media infection.  A dose of Toradol and Decadron were given to help reduce pain.  She continue to take the amoxicillin that you are prescribed by your urgent care for the entire duration as prescribed.

## 2022-06-26 ENCOUNTER — Other Ambulatory Visit: Payer: Self-pay

## 2022-06-26 ENCOUNTER — Encounter (HOSPITAL_BASED_OUTPATIENT_CLINIC_OR_DEPARTMENT_OTHER): Payer: Self-pay

## 2022-06-26 DIAGNOSIS — K029 Dental caries, unspecified: Secondary | ICD-10-CM | POA: Diagnosis not present

## 2022-06-26 DIAGNOSIS — K0889 Other specified disorders of teeth and supporting structures: Secondary | ICD-10-CM | POA: Diagnosis present

## 2022-06-26 NOTE — ED Triage Notes (Signed)
Pt arrives to ED POV c/o of dental pain. Pt states she has an abscess towards the back of her mouth. Pt has taken Tylenol and Ibuprofen with no relief. Swelling noted. Airway intact, no distress noted at this time.

## 2022-06-27 ENCOUNTER — Emergency Department (HOSPITAL_BASED_OUTPATIENT_CLINIC_OR_DEPARTMENT_OTHER)
Admission: EM | Admit: 2022-06-27 | Discharge: 2022-06-27 | Disposition: A | Discharge status: Home / Self Care | Payer: Medicaid Other | Attending: Emergency Medicine | Admitting: Emergency Medicine

## 2022-06-27 DIAGNOSIS — K029 Dental caries, unspecified: Secondary | ICD-10-CM

## 2022-06-27 MED ORDER — PENICILLIN V POTASSIUM 250 MG PO TABS
500.0000 mg | ORAL_TABLET | Freq: Once | ORAL | Status: AC
  Administered 2022-06-27: 500 mg via ORAL
  Filled 2022-06-27: qty 2

## 2022-06-27 MED ORDER — KETOROLAC TROMETHAMINE 30 MG/ML IJ SOLN
30.0000 mg | Freq: Once | INTRAMUSCULAR | Status: AC
  Administered 2022-06-27: 30 mg via INTRAMUSCULAR
  Filled 2022-06-27: qty 1

## 2022-06-27 MED ORDER — PENICILLIN V POTASSIUM 500 MG PO TABS: 500.0000 mg | ORAL_TABLET | Freq: Four times a day (QID) | ORAL | 0 refills | Status: AC

## 2022-06-27 NOTE — Discharge Instructions (Signed)
Pain is likely from a dental cavity, of starting antibiotics take as prescribed, I recommend using ibuprofen Tylenol every 4-6 hours as needed for pain control.  Please follow-up with your dentist on Tuesday for reassessment  Please come back to the emergency department if you develop tongue throat lip swelling difficulty breathing, unable to swallow your own saliva, symptoms are worsening despite 5 days of antibiotic use

## 2022-06-27 NOTE — ED Provider Notes (Signed)
[facility] EMERGENCY DEPT Provider Note   CSN: [id number] Arrival date & time: 06/26/22  2344     History  Chief Complaint  Patient presents with   Dental Pain    [name] is a 37 y.o. female.  HPI   Patient without significant medical history is presenting with complaints of left lower dental pain.  Patient states that started yesterday, came on suddenly, pain is mainly in her left lower jaw, does not radiate, she denies any tongue throat lip swelling difficulty breathing, no endorsing any difficulty with eye movement, she states that she has not seen dentist as long time is had issues with this tooth in the past.  She denies any fevers chills cough congestion she has other complaints.  She does endorse that she is seeing a dentist on Tuesday.  Home Medications Prior to Admission medications   Medication Sig Start Date End Date Taking? Authorizing Provider  penicillin v potassium (VEETID) 500 MG tablet Take 1 tablet (500 mg total) by mouth 4 (four) times daily for 7 days. 06/27/22 07/04/22 Yes Carroll Sage, PA-C  acetaminophen (TYLENOL) 325 MG tablet Take 650 mg by mouth every 6 (six) hours as needed.    [provider]  amoxicillin (AMOXIL) 875 MG tablet Take 1 tablet (875 mg total) by mouth 2 (two) times daily. 06/12/22   Wallis Bamberg, PA-C  cetirizine (ZYRTEC ALLERGY) 10 MG tablet Take 1 tablet (10 mg total) by mouth daily. 06/12/22   Wallis Bamberg, PA-C  ibuprofen (ADVIL) 800 MG tablet Take 1 tablet (800 mg total) by mouth 3 (three) times daily. 06/13/22   Smitty Knudsen, PA-C  ibuprofen (ADVIL,MOTRIN) 800 MG tablet Take 1 tablet (800 mg total) by mouth 3 (three) times daily. 07/03/18   Mardella Layman, MD  naproxen (NAPROSYN) 500 MG tablet Take 1 tablet (500 mg total) by mouth 2 (two) times daily with a meal. 11/03/21   Wallis Bamberg, PA-C  ondansetron (ZOFRAN ODT) 4 MG disintegrating tablet Take 1 tablet (4 mg total) every 8 (eight) hours as needed  by mouth for nausea or vomiting. 04/24/17   Moss, Amber, DO  Prenatal Vit-Fe Fumarate-FA (PRENATAL MULTIVITAMIN) TABS tablet Take 1 tablet by mouth daily at 12 noon. 12/14/15   [name], Augusto Gamble, MD  pseudoephedrine (SUDAFED) 60 MG tablet Take 1 tablet (60 mg total) by mouth every 8 (eight) hours as needed for congestion. 06/12/22   Wallis Bamberg, PA-C  tiZANidine (ZANAFLEX) 4 MG tablet Take 1 tablet (4 mg total) by mouth at bedtime. 11/03/21   Wallis Bamberg, PA-C      Allergies    Patient has no known allergies.    Review of Systems   Review of Systems  Constitutional:  Negative for chills and fever.  HENT:  Positive for dental problem.   Respiratory:  Negative for shortness of breath.   Cardiovascular:  Negative for chest pain.  Gastrointestinal:  Negative for abdominal pain.  Neurological:  Negative for headaches.    Physical Exam Updated Vital Signs BP (!) 138/90   Pulse 76   Temp 97.8 F (36.6 C) (Oral)   Resp 16   Ht 5\' 8"  (1.727 m)   Wt 99.8 kg   LMP 05/17/2022   SpO2 100%   BMI 33.45 kg/m  Physical Exam Vitals and nursing note reviewed.  Constitutional:      General: She is not in acute distress.    Appearance: She is not ill-appearing.  HENT:     Head: Normocephalic  and atraumatic.     Left Ear: Tympanic membrane, ear canal and external ear normal.     Nose: No congestion.     Mouth/Throat:     Mouth: Mucous membranes are moist.     Pharynx: Oropharynx is clear. No oropharyngeal exudate or posterior oropharyngeal erythema.     Comments: No trismus no torticollis no oral edema present, there is no facial swelling no overlying skin changes, tongue and uvula are both midline controlling oral secretions tonsils are both equal symmetric bilaterally, no submandibular swelling no muffled tone voice, patient did have poor dental hygiene, she has significant erosion of the left bottom molar, there is no evidence of gingivitis or drainable abscess at this time. Eyes:      Extraocular Movements: Extraocular movements intact.     Conjunctiva/sclera: Conjunctivae normal.  Cardiovascular:     Rate and Rhythm: Normal rate and regular rhythm.     Pulses: Normal pulses.     Heart sounds: No murmur heard.    No friction rub. No gallop.  Pulmonary:     Effort: No respiratory distress.     Breath sounds: No wheezing, rhonchi or rales.  Skin:    General: Skin is warm and dry.  Neurological:     Mental Status: She is alert.  Psychiatric:        Mood and Affect: Mood normal.     ED Results / Procedures / Treatments   Labs (all labs ordered are listed, but only abnormal results are displayed) Labs Reviewed - No data to display  EKG None  Radiology No results found.  Procedures Procedures    Medications Ordered in ED Medications  ketorolac (TORADOL) 30 MG/ML injection 30 mg (has no administration in time range)  penicillin v potassium (VEETID) tablet 500 mg (has no administration in time range)    ED Course/ Medical Decision Making/ A&P                           Medical Decision Making  This patient presents to the ED for concern of dental pain, this involves an extensive number of treatment options, and is a complaint that carries with it a high risk of complications and morbidity.  The differential diagnosis includes orbital cellulitis, Ludwig angina, retropharyngeal/peritonsillar abscess    Additional history obtained:  Additional history obtained from N/A External records from outside source obtained and reviewed including recent ER notes   Co morbidities that complicate the patient evaluation  N/A  Social Determinants of Health:  N/A    Lab Tests:  I Ordered, and personally interpreted labs.  The pertinent results include: N/A   Imaging Studies ordered:  I ordered imaging studies including N/A I independently visualized and interpreted imaging which showed N/A I agree with the radiologist interpretation   Cardiac  Monitoring:  The patient was maintained on a cardiac monitor.  I personally viewed and interpreted the cardiac monitored which showed an underlying rhythm of: N/A   Medicines ordered and prescription drug management:  I ordered medication including Toradol, penicillin I have reviewed the patients home medicines and have made adjustments as needed  Critical Interventions:  N/A   Reevaluation:  Presents with dental pain, she had a benign physical exam, will provide patient with first dose of antibiotics and a dose of Toradol and discharged home she is agreement this plan  Consultations Obtained: N/A    Test Considered:  CT maxillofacial-deferred as my suspicion for orbital  cellulitis or drainable abscess is low at this time.    Rule out I have low suspicion for peritonsillar abscess, retropharyngeal abscess, or Ludwig angina as oropharynx was visualized tongue and uvula were both midline, there is no exudates, erythema or edema noted in the posterior pillars or on/ around tonsils.  Low suspicion for an drainable abscess as gumline were palpated no fluctuance or induration felt.  Low suspicion for periorbital or orbital cellulitis as patient face had no erythematous, patient EOMs were intact, he had no pain with eye movement.     Dispostion and problem list  After consideration of the diagnostic results and the patients response to treatment, I feel that the patent would benefit from discharge.  Dental pain-likely secondary due to a cavity, possible underlying dental abscess, started on antibiotics, recommend over-the-counter pain medications, recommend that she follows up with a dentist on Tuesday, strict return precautions            Final Clinical Impression(s) / ED Diagnoses Final diagnoses:  Dental cavity    Rx / DC Orders ED Discharge Orders          Ordered    penicillin v potassium (VEETID) 500 MG tablet  4 times daily        06/27/22 0230               Marcello Fennel, PA-C 06/27/22 0231    Merryl Hacker, MD 06/28/22 [phone]

## 2023-08-30 ENCOUNTER — Ambulatory Visit
Admission: EM | Admit: 2023-08-30 | Discharge: 2023-08-30 | Disposition: A | Discharge status: Home / Self Care | Attending: Family Medicine | Admitting: Family Medicine

## 2023-08-30 DIAGNOSIS — J101 Influenza due to other identified influenza virus with other respiratory manifestations: Secondary | ICD-10-CM | POA: Diagnosis not present

## 2023-08-30 LAB — POC COVID19/FLU A&B COMBO
Covid Antigen, POC: NEGATIVE
Influenza A Antigen, POC: POSITIVE — AB
Influenza B Antigen, POC: NEGATIVE

## 2023-08-30 LAB — POCT RAPID STREP A (OFFICE): Rapid Strep A Screen: NEGATIVE

## 2023-08-30 MED ORDER — OSELTAMIVIR PHOSPHATE 75 MG PO CAPS: 75.0000 mg | ORAL_CAPSULE | Freq: Two times a day (BID) | ORAL | 0 refills | Status: DC

## 2023-08-30 MED ORDER — BENZONATATE 200 MG PO CAPS: 200.0000 mg | ORAL_CAPSULE | Freq: Three times a day (TID) | ORAL | 0 refills | Status: DC | PRN

## 2023-08-30 NOTE — ED Triage Notes (Signed)
 Pt presents with c/o cough, sore throat, body aches and fever at home X 2 days. Pt states she has taken Mucinex and Dayquil.

## 2023-08-30 NOTE — ED Provider Notes (Signed)
 [facility]    CSN: [id number] Arrival date & time: 08/30/23  0948      History   Chief Complaint Chief Complaint  Patient presents with   Cough   Generalized Body Aches   Sore Throat    HPI [name] is a 38 y.o. female  presents for evaluation of URI symptoms for 2 days. Patient reports associated symptoms of cough, congestion, body aches, sore throat, fever 102 degrees. Denies N/V/D, ear pain, shortness of breath. Patient does not have a hx of asthma. Patient is not an active smoker.   Reports children had similar symptoms.  Pt has taken DayQuil NyQuil, Mucinex OTC for symptoms. Pt has no other concerns at this time.    Cough Associated symptoms: myalgias and sore throat   Sore Throat    Past Medical History:  Diagnosis Date   Headache    Incompetent cervix in pregnancy 03/30/2012   cerclage with last preg.   Preterm labor     Patient Active Problem List   Diagnosis Date Noted   Nipple infection in female 04/17/2017   Active labor at term 12/12/2015   Fetal demise 06/01/14   IUFD (intrauterine fetal death) 2014-06-01   Abdominal pain in pregnancy    [redacted] weeks gestation of pregnancy    Active labor 12/06/2013   SVD (spontaneous vaginal delivery) 12/06/2013   Urethral lesion 05/08/2013   Incompetent cervix in pregnancy 03/30/2012    Past Surgical History:  Procedure Laterality Date   CERVICAL CERCLAGE     CERVICAL CERCLAGE  03/30/2012   Procedure: CERCLAGE CERVICAL;  Surgeon: Sherron Monday, MD;  Location: [facility];  Service: Gynecology;  Laterality: N/A;   CERVICAL CERCLAGE N/A 08/19/2013   Procedure: CERCLAGE CERVICAL;  Surgeon: Sherron Monday, MD;  Location: [facility];  Service: Gynecology;  Laterality: N/A;   CERVICAL CERCLAGE N/A 06/17/2015   Procedure: CERCLAGE CERVICAL;  Surgeon: Sherian Rein, MD;  Location: [facility];  Service: Gynecology;  Laterality: N/A;   CHOLECYSTECTOMY  2008   DILATION AND CURETTAGE OF UTERUS     DILATION AND  EVACUATION  04/07/2012   Procedure: DILATATION AND EVACUATION;  Surgeon: Oliver Pila, MD;  Location: [facility];  Service: Gynecology;  Laterality: N/A;   TUBAL LIGATION Bilateral 12/13/2015   Procedure: POST PARTUM TUBAL LIGATION;  Surgeon: Edwinna Areola, DO;  Location: [facility];  Service: Gynecology;  Laterality: Bilateral;    OB History     Gravida  8   Para  5   Term  4   Preterm      AB  3   Living  4      SAB  3   IAB      Ectopic      Multiple  0   Live Births  4            Home Medications    Prior to Admission medications   Medication Sig Start Date End Date Taking? Authorizing Provider  benzonatate (TESSALON) 200 MG capsule Take 1 capsule (200 mg total) by mouth 3 (three) times daily as needed. 08/30/23  Yes Radford Pax, NP  oseltamivir (TAMIFLU) 75 MG capsule Take 1 capsule (75 mg total) by mouth every 12 (twelve) hours. 08/30/23  Yes Radford Pax, NP  acetaminophen (TYLENOL) 325 MG tablet Take 650 mg by mouth every 6 (six) hours as needed.    [provider]  amoxicillin (AMOXIL) 875 MG tablet Take 1 tablet (875 mg total)  by mouth 2 (two) times daily. 06/12/22   Wallis Bamberg, PA-C  cetirizine (ZYRTEC ALLERGY) 10 MG tablet Take 1 tablet (10 mg total) by mouth daily. 06/12/22   Wallis Bamberg, PA-C  ibuprofen (ADVIL) 800 MG tablet Take 1 tablet (800 mg total) by mouth 3 (three) times daily. 06/13/22   Smitty Knudsen, PA-C  ibuprofen (ADVIL,MOTRIN) 800 MG tablet Take 1 tablet (800 mg total) by mouth 3 (three) times daily. 07/03/18   Mardella Layman, MD  naproxen (NAPROSYN) 500 MG tablet Take 1 tablet (500 mg total) by mouth 2 (two) times daily with a meal. 11/03/21   Wallis Bamberg, PA-C  ondansetron (ZOFRAN ODT) 4 MG disintegrating tablet Take 1 tablet (4 mg total) every 8 (eight) hours as needed by mouth for nausea or vomiting. 04/24/17   Moss, Amber, DO  Prenatal Vit-Fe Fumarate-FA (PRENATAL MULTIVITAMIN) TABS tablet Take 1 tablet by mouth daily at  12 noon. 12/14/15   [name], Augusto Gamble, MD  pseudoephedrine (SUDAFED) 60 MG tablet Take 1 tablet (60 mg total) by mouth every 8 (eight) hours as needed for congestion. 06/12/22   Wallis Bamberg, PA-C  tiZANidine (ZANAFLEX) 4 MG tablet Take 1 tablet (4 mg total) by mouth at bedtime. 11/03/21   Wallis Bamberg, PA-C    Family History Family History  Problem Relation Age of Onset   Diabetes Mother    Hypertension Mother    Kidney disease Mother    Heart disease Mother     Social History Social History   Tobacco Use   Smoking status: Former    Current packs/day: 0.00    Average packs/day: 0.3 packs/day for 2.0 years (0.5 ttl pk-yrs)    Types: Cigarettes    Start date: 08/15/2008    Quit date: 08/15/2010    Years since quitting: 13.0   Smokeless tobacco: Never  Substance Use Topics   Alcohol use: Yes    Comment: Social    Drug use: No     Allergies   Patient has no known allergies.   Review of Systems Review of Systems  HENT:  Positive for congestion and sore throat.   Respiratory:  Positive for cough.   Musculoskeletal:  Positive for myalgias.     Physical Exam Triage Vital Signs ED Triage Vitals  Encounter Vitals Group     BP 08/30/23 1037 113/78     Systolic BP Percentile --      Diastolic BP Percentile --      Pulse Rate 08/30/23 1037 97     Resp 08/30/23 1037 17     Temp 08/30/23 1037 98.5 F (36.9 C)     Temp Source 08/30/23 1037 Oral     SpO2 08/30/23 1037 96 %     Weight --      Height --      Head Circumference --      Peak Flow --      Pain Score 08/30/23 1036 8     Pain Loc --      Pain Education --      Exclude from Growth Chart --    No data found.  Updated Vital Signs BP 113/78 (BP Location: Left Arm)   Pulse 97   Temp 98.5 F (36.9 C) (Oral)   Resp 17   LMP 07/16/2023 (Exact Date)   SpO2 96%   Visual Acuity Right Eye Distance:   Left Eye Distance:   Bilateral Distance:    Right Eye Near:   Left Eye Near:  Bilateral Near:      Physical Exam Vitals and nursing note reviewed.  Constitutional:      General: She is not in acute distress.    Appearance: She is well-developed. She is not ill-appearing.  HENT:     Head: Normocephalic and atraumatic.     Right Ear: Tympanic membrane and ear canal normal.     Left Ear: Tympanic membrane and ear canal normal.     Nose: Congestion present.     Mouth/Throat:     Mouth: Mucous membranes are moist.     Pharynx: Oropharynx is clear. Uvula midline. Posterior oropharyngeal erythema present.     Tonsils: No tonsillar exudate or tonsillar abscesses.  Eyes:     Conjunctiva/sclera: Conjunctivae normal.     Pupils: Pupils are equal, round, and reactive to light.  Cardiovascular:     Rate and Rhythm: Normal rate and regular rhythm.     Heart sounds: Normal heart sounds.  Pulmonary:     Effort: Pulmonary effort is normal.     Breath sounds: Normal breath sounds.  Musculoskeletal:     Cervical back: Normal range of motion and neck supple.  Lymphadenopathy:     Cervical: No cervical adenopathy.  Skin:    General: Skin is warm and dry.  Neurological:     General: No focal deficit present.     Mental Status: She is alert and oriented to person, place, and time.  Psychiatric:        Mood and Affect: Mood normal.        Behavior: Behavior normal.      UC Treatments / Results  Labs (all labs ordered are listed, but only abnormal results are displayed) Labs Reviewed  POC COVID19/FLU A&B COMBO - Abnormal; Notable for the following components:      Result Value   Influenza A Antigen, POC Positive (*)    All other components within normal limits  POCT RAPID STREP A (OFFICE)    EKG   Radiology No results found.  Procedures Procedures (including critical care time)  Medications Ordered in UC Medications - No data to display  Initial Impression / Assessment and Plan / UC Course  I have reviewed the triage vital signs and the nursing notes.  Pertinent labs &  imaging results that were available during my care of the patient were reviewed by me and considered in my medical decision making (see chart for details).     Reviewed exam and symptoms with patient.  No red flags.  Positive influenza A.  Start Tamiflu and Tessalon.  Discussed viral illness and symptomatic treatment.  PCP follow-up if symptoms do not improve.  ER precautions reviewed and patient verbalized understanding. Final Clinical Impressions(s) / UC Diagnoses   Final diagnoses:  Influenza A     Discharge Instructions      Start Tamiflu twice daily for 5 days.  You may take Tessalon 3 times a day as needed for your cough.  Lots of rest and fluids.  You may use over-the-counter Tylenol or ibuprofen as needed.  Follow-up with your PCP if your symptoms do not improve.  Please go to the ER for any worsening symptoms.  Hope you feel better soon!     ED Prescriptions     Medication Sig Dispense Auth. Provider   oseltamivir (TAMIFLU) 75 MG capsule Take 1 capsule (75 mg total) by mouth every 12 (twelve) hours. 10 capsule Radford Pax, NP   benzonatate (TESSALON) 200 MG capsule Take  1 capsule (200 mg total) by mouth 3 (three) times daily as needed. 20 capsule Radford Pax, NP      PDMP not reviewed this encounter.   Radford Pax, NP 08/30/23 1108

## 2023-08-30 NOTE — Discharge Instructions (Addendum)
 Start Tamiflu twice daily for 5 days.  You may take Tessalon 3 times a day as needed for your cough.  Lots of rest and fluids.  You may use over-the-counter Tylenol or ibuprofen as needed.  Follow-up with your PCP if your symptoms do not improve.  Please go to the ER for any worsening symptoms.  Hope you feel better soon!

## 2024-01-04 ENCOUNTER — Inpatient Hospital Stay (HOSPITAL_COMMUNITY)
Admission: AD | Admit: 2024-01-04 | Discharge: 2024-01-04 | Disposition: A | Discharge status: Home / Self Care | Attending: Obstetrics and Gynecology | Admitting: Obstetrics and Gynecology

## 2024-01-04 ENCOUNTER — Inpatient Hospital Stay (HOSPITAL_COMMUNITY)

## 2024-01-04 ENCOUNTER — Encounter (HOSPITAL_COMMUNITY): Payer: Self-pay | Admitting: Obstetrics and Gynecology

## 2024-01-04 DIAGNOSIS — B9689 Other specified bacterial agents as the cause of diseases classified elsewhere: Secondary | ICD-10-CM | POA: Diagnosis not present

## 2024-01-04 DIAGNOSIS — O26851 Spotting complicating pregnancy, first trimester: Secondary | ICD-10-CM | POA: Diagnosis present

## 2024-01-04 DIAGNOSIS — O23591 Infection of other part of genital tract in pregnancy, first trimester: Secondary | ICD-10-CM | POA: Diagnosis not present

## 2024-01-04 DIAGNOSIS — Z3A08 8 weeks gestation of pregnancy: Secondary | ICD-10-CM | POA: Diagnosis not present

## 2024-01-04 DIAGNOSIS — N939 Abnormal uterine and vaginal bleeding, unspecified: Secondary | ICD-10-CM | POA: Diagnosis not present

## 2024-01-04 DIAGNOSIS — Z3491 Encounter for supervision of normal pregnancy, unspecified, first trimester: Secondary | ICD-10-CM

## 2024-01-04 HISTORY — DX: Urinary tract infection, site not specified: N39.0

## 2024-01-04 HISTORY — DX: Type 2 diabetes mellitus without complications: E11.9

## 2024-01-04 LAB — URINALYSIS, ROUTINE W REFLEX MICROSCOPIC
Bacteria, UA: NONE SEEN
Bilirubin Urine: NEGATIVE
Glucose, UA: NEGATIVE mg/dL
Hgb urine dipstick: NEGATIVE
Ketones, ur: NEGATIVE mg/dL
Nitrite: NEGATIVE
Protein, ur: NEGATIVE mg/dL
Specific Gravity, Urine: 1.011 (ref 1.005–1.030)
pH: 7 (ref 5.0–8.0)

## 2024-01-04 LAB — CBC
HCT: 36.9 % (ref 36.0–46.0)
Hemoglobin: 12.1 g/dL (ref 12.0–15.0)
MCH: 27.3 pg (ref 26.0–34.0)
MCHC: 32.8 g/dL (ref 30.0–36.0)
MCV: 83.3 fL (ref 80.0–100.0)
Platelets: 276 K/uL (ref 150–400)
RBC: 4.43 MIL/uL (ref 3.87–5.11)
RDW: 15 % (ref 11.5–15.5)
WBC: 7.8 K/uL (ref 4.0–10.5)
nRBC: 0 % (ref 0.0–0.2)

## 2024-01-04 LAB — WET PREP, GENITAL
Sperm: NONE SEEN
Trich, Wet Prep: NONE SEEN
WBC, Wet Prep HPF POC: >=10 — AB (ref <10)
Yeast Wet Prep HPF POC: NONE SEEN

## 2024-01-04 LAB — HCG, QUANTITATIVE, PREGNANCY: hCG, Beta Chain, Quant, S: 123593 m[IU]/mL — ABNORMAL HIGH (ref <5)

## 2024-01-04 LAB — POCT PREGNANCY, URINE: Preg Test, Ur: POSITIVE — AB

## 2024-01-04 MED ORDER — METRONIDAZOLE 500 MG PO TABS: 500.0000 mg | ORAL_TABLET | Freq: Two times a day (BID) | ORAL | 0 refills | Status: DC

## 2024-01-04 MED ORDER — PRENATAL COMPLETE 14-0.4 MG PO TABS: 1.0000 | ORAL_TABLET | Freq: Every day | ORAL | 3 refills | Status: AC

## 2024-01-04 MED ORDER — ONDANSETRON HCL 4 MG PO TABS: 8.0000 mg | ORAL_TABLET | Freq: Two times a day (BID) | ORAL | 0 refills | Status: DC

## 2024-01-04 NOTE — Discharge Instructions (Signed)

## 2024-01-04 NOTE — MAU Provider Note (Signed)
 S [name] is a 38 y.o. [phone] patient who presents to MAU today with complaint of having some spotting on both 7/14 and 7/16 light red. She denies passing any clots or having any lower abdominal cramping or pain.  She reports she had a home positive pregnancy test and urine approximately 3 weeks ago and has not established prenatal care yet.  Patient is an un established therefore we will confirm IUP.  Patient denies any active vaginal bleeding at this time.    O BP 135/72 (BP Location: Right Arm)   Pulse 80   Temp 98.9 F (37.2 C) (Oral)   Resp 17   Ht 5' 8 (1.727 m)   Wt 101.3 kg   LMP 11/07/2023   SpO2 100%   BMI 33.97 kg/m  Physical Exam Vitals and nursing note reviewed.  Constitutional:      General: She is not in acute distress.    Appearance: She is well-developed. She is obese. She is not ill-appearing.  HENT:     Head: Normocephalic.     Nose: Nose normal.     Mouth/Throat:     Mouth: Mucous membranes are moist.  Cardiovascular:     Rate and Rhythm: Normal rate.  Pulmonary:     Effort: Pulmonary effort is normal.  Abdominal:     Palpations: Abdomen is soft.     Tenderness: There is no abdominal tenderness.  Musculoskeletal:        General: Normal range of motion.     Cervical back: Normal range of motion.  Skin:    General: Skin is warm.  Neurological:     Mental Status: She is alert and oriented to person, place, and time.  Psychiatric:        Mood and Affect: Mood normal.        Behavior: Behavior normal.     MDM   HIGH   Vaginal bleeding in early pregnacy CBC ( Unremarkable) HCG Quant: [phone] ABO: O Positive OB Ultrasound ( Confirms an IUP ~ [redacted]w[redacted]d) Vaginal Swabs ( CW BV - Will plan for RX at discharge) UA: Unremarkable   Differential diagnosis considered for 1st trimester vaginal bleeding includes but is not limited to: ectopic pregnancy, complete spontaneous abortion, incomplete abortion, missed abortion, threatened  abortion, embryonic/fetal demise, cervical insufficiency, cervical or vaginal disorder    Orders Placed This Encounter  Procedures   Wet prep, genital    Standing Status:   Standing    Number of Occurrences:   1   US  OB Comp Less 14 Wks    Standing Status:   Standing    Number of Occurrences:   1    Symptom/Reason for Exam:   Vaginal bleeding affecting early pregnancy [[id number]]   Urinalysis, Routine w reflex microscopic -Urine, Clean Catch    Standing Status:   Standing    Number of Occurrences:   1    Specimen Source:   Urine, Clean Catch [[id number]]   CBC    Standing Status:   Standing    Number of Occurrences:   1   hCG, quantitative, pregnancy    Standing Status:   Standing    Number of Occurrences:   1   Pregnancy, urine POC    Standing Status:   Standing    Number of Occurrences:   1      Results for orders placed or performed during the hospital encounter of 01/04/24 (from the past 24 hours)  Pregnancy, urine POC  Status: Abnormal   Collection Time: 01/04/24 12:55 PM  Result Value Ref Range   Preg Test, Ur POSITIVE (A) NEGATIVE  Wet prep, genital     Status: Abnormal   Collection Time: 01/04/24 12:57 PM   Specimen: PATH Cytology Cervicovaginal Ancillary Only  Result Value Ref Range   Yeast Wet Prep HPF POC NONE SEEN NONE SEEN   Trich, Wet Prep NONE SEEN NONE SEEN   Clue Cells Wet Prep HPF POC PRESENT (A) NONE SEEN   WBC, Wet Prep HPF POC >=10 (A) <10   Sperm NONE SEEN   Urinalysis, Routine w reflex microscopic -Urine, Clean Catch     Status: Abnormal   Collection Time: 01/04/24 12:57 PM  Result Value Ref Range   Color, Urine YELLOW YELLOW   APPearance CLEAR CLEAR   Specific Gravity, Urine 1.011 1.005 - 1.030   pH 7.0 5.0 - 8.0   Glucose, UA NEGATIVE NEGATIVE mg/dL   Hgb urine dipstick NEGATIVE NEGATIVE   Bilirubin Urine NEGATIVE NEGATIVE   Ketones, ur NEGATIVE NEGATIVE mg/dL   Protein, ur NEGATIVE NEGATIVE mg/dL   Nitrite NEGATIVE NEGATIVE   Leukocytes,Ua  TRACE (A) NEGATIVE   RBC / HPF 0-5 0 - 5 RBC/hpf   WBC, UA 0-5 0 - 5 WBC/hpf   Bacteria, UA NONE SEEN NONE SEEN   Squamous Epithelial / HPF 0-5 0 - 5 /HPF  CBC     Status: None   Collection Time: 01/04/24  1:28 PM  Result Value Ref Range   WBC 7.8 4.0 - 10.5 K/uL   RBC 4.43 3.87 - 5.11 MIL/uL   Hemoglobin 12.1 12.0 - 15.0 g/dL   HCT 63.0 63.9 - 53.9 %   MCV 83.3 80.0 - 100.0 fL   MCH 27.3 26.0 - 34.0 pg   MCHC 32.8 30.0 - 36.0 g/dL   RDW 84.9 88.4 - 84.4 %   Platelets 276 150 - 400 K/uL   nRBC 0.0 0.0 - 0.2 %  hCG, quantitative, pregnancy     Status: Abnormal   Collection Time: 01/04/24  1:28 PM  Result Value Ref Range   hCG, Beta Chain, Quant, S 123,593 (H) <5 mIU/mL      I have reviewed the patient chart and performed the physical exam . I have ordered & interpreted the lab results and reviewed and interpreted the the ultrasound images and agree with the radiology  report  Medications ordered as stated below.  A/P as described below.  Counseling and education provided and patient agreeable  with plan as described below. Verbalized understanding.    ASSESSMENT Medical screening exam complete  1. Normal intrauterine pregnancy on prenatal ultrasound in first trimester  2. Vaginal spotting (Primary)  3. Bacterial vaginosis      PLAN   Allergies as of 01/04/2024   No Known Allergies      Medication List     STOP taking these medications    amoxicillin  875 MG tablet Commonly known as: AMOXIL    benzonatate  200 MG capsule Commonly known as: TESSALON    ibuprofen  800 MG tablet Commonly known as: ADVIL    naproxen  500 MG tablet Commonly known as: NAPROSYN    oseltamivir  75 MG capsule Commonly known as: TAMIFLU    pseudoephedrine  60 MG tablet Commonly known as: SUDAFED   tiZANidine  4 MG tablet Commonly known as: Zanaflex        TAKE these medications    acetaminophen  325 MG tablet Commonly known as: TYLENOL  Take 650 mg by mouth every 6 (six) hours  as  needed.   cetirizine  10 MG tablet Commonly known as: ZyrTEC  Allergy Take 1 tablet (10 mg total) by mouth daily.   metroNIDAZOLE  500 MG tablet Commonly known as: FLAGYL  Take 1 tablet (500 mg total) by mouth 2 (two) times daily.   ondansetron  4 MG disintegrating tablet Commonly known as: Zofran  ODT Take 1 tablet (4 mg total) every 8 (eight) hours as needed by mouth for nausea or vomiting.   ondansetron  4 MG tablet Commonly known as: Zofran  Take 2 tablets (8 mg total) by mouth 2 (two) times daily.   Prenatal Complete 14-0.4 MG Tabs Take 1 tablet by mouth daily.   prenatal multivitamin Tabs tablet Take 1 tablet by mouth daily at 12 noon.        Establish Prenatal Care   Discharge from MAU in stable condition  See AVS for full description of educational information and instructions provided to the patient at time of discharge  List of options for follow-up given   Warning signs for worsening condition that would warrant emergency follow-up discussed Patient may return to MAU as needed   Littie Olam LABOR, NP 01/04/2024 3:53 PM

## 2024-01-04 NOTE — MAU Note (Signed)
 [name] is a 38 y.o. at Unknown here in MAU reporting: was having some spotting 7/16 and 7/14. Light light red, no clots. Denies recent intercourse. Having some discomfort? Cramping.  Has not had pregnancy confirmed yet.  'knows she is high risk'. +HPT ~3wks ago. LMP: 5/20 Onset of complaint: 7/14 Pain score: mild Vitals:   01/04/24 1235  BP: 135/72  Pulse: 80  Resp: 17  Temp: 98.9 F (37.2 C)  SpO2: 100%      Lab orders placed from triage:  UA, UPT, vag swabs

## 2024-01-05 LAB — GC/CHLAMYDIA PROBE AMP (~~LOC~~) NOT AT ARMC
Chlamydia: NEGATIVE
Comment: NEGATIVE
Comment: NORMAL
Neisseria Gonorrhea: NEGATIVE

## 2024-01-30 ENCOUNTER — Telehealth (HOSPITAL_COMMUNITY): Payer: Self-pay | Admitting: *Deleted

## 2024-01-30 ENCOUNTER — Encounter (HOSPITAL_COMMUNITY): Payer: Self-pay | Admitting: *Deleted

## 2024-01-30 NOTE — Telephone Encounter (Signed)
 Preadmission screen

## 2024-02-07 NOTE — H&P (Signed)
 [name] is a 38 y.o. female [phone] @ 13 weeks presenting for scheduled cerclage for history of cervical insufficiency.  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   OB History     Gravida  9   Para  5   Term  4   Preterm      AB  3   Living  4      SAB  3   IAB      Ectopic      Multiple  0   Live Births  4          Past Medical History:  Diagnosis Date   Diabetes mellitus without complication (HCC)    Headache    Incompetent cervix in pregnancy 03/30/2012   cerclage with last preg.   Preterm labor    UTI (urinary tract infection)    Past Surgical History:  Procedure Laterality Date   CERVICAL CERCLAGE     CERVICAL CERCLAGE  03/30/2012   Procedure: CERCLAGE CERVICAL;  Surgeon: Natalie Marshall, MD;  Location: [facility];  Service: Gynecology;  Laterality: N/A;   CERVICAL CERCLAGE N/A 08/19/2013   Procedure: CERCLAGE CERVICAL;  Surgeon: Natalie Marshall, MD;  Location: [facility];  Service: Gynecology;  Laterality: N/A;   CERVICAL CERCLAGE N/A 06/17/2015   Procedure: CERCLAGE CERVICAL;  Surgeon: Natalie Buba, MD;  Location: [facility];  Service: Gynecology;  Laterality: N/A;   CHOLECYSTECTOMY  06/20/2006   DILATION AND CURETTAGE OF UTERUS     DILATION AND EVACUATION  04/07/2012   Procedure: DILATATION AND EVACUATION;  Surgeon: Natalie LELON Bunker, MD;  Location: [facility];  Service: Gynecology;  Laterality: N/A;   TUBAL LIGATION Bilateral 12/13/2015   procedure was NOT done   Family History: family history includes Diabetes in her mother; Heart disease in her mother; Hypertension in her mother; Kidney disease in her mother. Social History:  reports that she quit smoking about 13 years ago. Her smoking use  included cigarettes. She started smoking about 15 years ago. She has a 0.5 pack-year smoking history. She has never used smokeless tobacco. She reports that she does not currently use alcohol. She reports that she does not use drugs.     Review of Systems  Respiratory:  Negative for shortness of breath.   Cardiovascular:  Negative for chest pain.  Gastrointestinal:  Negative for abdominal pain and constipation.  Genitourinary:  Negative for pelvic pain, vaginal bleeding and vaginal discharge.  Musculoskeletal:  Negative for gait problem.   History   Last menstrual period 11/07/2023. Exam Physical Exam Constitutional:      General: She is not in acute distress.    Appearance: Normal appearance.  HENT:     Head: Normocephalic and atraumatic.  Pulmonary:     Effort: Pulmonary effort is normal.  Musculoskeletal:        General: Normal range of motion.  Skin:    General: Skin is warm and dry.  Neurological:     Mental Status: She is alert.  Psychiatric:        Mood and Affect: Mood normal.        Behavior: Behavior normal.     Prenatal labs: ABO, Rh:  O+ Antibody:  Neg Rubella:  Imm RPR:   Neg HBsAg:   Neg HIV:   Neg  Assessment/Plan: 38 yo with history of cervical insufficiency here for Mcdonald cervical cerclage - Discussed R/B of procedure-risks including but not limited to bleeding, infection, PPROM, fetal loss   [name] 02/07/2024, 8:14 PM

## 2024-02-08 ENCOUNTER — Encounter (HOSPITAL_COMMUNITY)
Admission: RE | Disposition: A | Discharge status: Home / Self Care | Payer: Self-pay | Source: Home / Self Care | Attending: Student

## 2024-02-08 ENCOUNTER — Ambulatory Visit (HOSPITAL_BASED_OUTPATIENT_CLINIC_OR_DEPARTMENT_OTHER): Admitting: Certified Registered Nurse Anesthetist

## 2024-02-08 ENCOUNTER — Ambulatory Visit (HOSPITAL_COMMUNITY): Admitting: Certified Registered Nurse Anesthetist

## 2024-02-08 ENCOUNTER — Ambulatory Visit (HOSPITAL_COMMUNITY)
Admission: RE | Admit: 2024-02-08 | Discharge: 2024-02-08 | Disposition: A | Discharge status: Home / Self Care | Attending: Student | Admitting: Student

## 2024-02-08 ENCOUNTER — Other Ambulatory Visit: Payer: Self-pay

## 2024-02-08 ENCOUNTER — Encounter (HOSPITAL_COMMUNITY): Payer: Self-pay | Admitting: Student

## 2024-02-08 DIAGNOSIS — N883 Incompetence of cervix uteri: Secondary | ICD-10-CM | POA: Diagnosis present

## 2024-02-08 DIAGNOSIS — Z3A00 Weeks of gestation of pregnancy not specified: Secondary | ICD-10-CM

## 2024-02-08 DIAGNOSIS — O3431 Maternal care for cervical incompetence, first trimester: Secondary | ICD-10-CM | POA: Diagnosis not present

## 2024-02-08 DIAGNOSIS — Z87891 Personal history of nicotine dependence: Secondary | ICD-10-CM | POA: Insufficient documentation

## 2024-02-08 DIAGNOSIS — O24911 Unspecified diabetes mellitus in pregnancy, first trimester: Secondary | ICD-10-CM | POA: Insufficient documentation

## 2024-02-08 DIAGNOSIS — Z833 Family history of diabetes mellitus: Secondary | ICD-10-CM | POA: Insufficient documentation

## 2024-02-08 DIAGNOSIS — Z3A13 13 weeks gestation of pregnancy: Secondary | ICD-10-CM | POA: Insufficient documentation

## 2024-02-08 DIAGNOSIS — O3430 Maternal care for cervical incompetence, unspecified trimester: Secondary | ICD-10-CM | POA: Diagnosis not present

## 2024-02-08 HISTORY — PX: CERVICAL CERCLAGE: SHX1329

## 2024-02-08 LAB — CBC
HCT: 37.7 % (ref 36.0–46.0)
Hemoglobin: 12.8 g/dL (ref 12.0–15.0)
MCH: 28.2 pg (ref 26.0–34.0)
MCHC: 34 g/dL (ref 30.0–36.0)
MCV: 83 fL (ref 80.0–100.0)
Platelets: 241 K/uL (ref 150–400)
RBC: 4.54 MIL/uL (ref 3.87–5.11)
RDW: 14 % (ref 11.5–15.5)
WBC: 9.1 K/uL (ref 4.0–10.5)
nRBC: 0 % (ref 0.0–0.2)

## 2024-02-08 LAB — GLUCOSE, CAPILLARY
Glucose-Capillary: 105 mg/dL — ABNORMAL HIGH (ref 70–99)
Glucose-Capillary: 88 mg/dL (ref 70–99)

## 2024-02-08 LAB — TYPE AND SCREEN
ABO/RH(D): O POS
Antibody Screen: NEGATIVE

## 2024-02-08 SURGERY — CERCLAGE, CERVIX, VAGINAL APPROACH
Anesthesia: Spinal

## 2024-02-08 MED ORDER — FENTANYL CITRATE (PF) 100 MCG/2ML IJ SOLN: 25.0000 ug | INTRAMUSCULAR | Status: DC | PRN

## 2024-02-08 MED ORDER — FENTANYL CITRATE (PF) 100 MCG/2ML IJ SOLN
INTRAMUSCULAR | Status: DC | PRN
  Administered 2024-02-08: 15 ug via INTRATHECAL

## 2024-02-08 MED ORDER — FENTANYL CITRATE (PF) 100 MCG/2ML IJ SOLN
INTRAMUSCULAR | Status: AC
  Filled 2024-02-08: qty 2

## 2024-02-08 MED ORDER — BUPIVACAINE HCL (PF) 0.25 % IJ SOLN
INTRAMUSCULAR | Status: AC
Start: 2024-02-08 — End: 2024-02-08
  Filled 2024-02-08: qty 30

## 2024-02-08 MED ORDER — LACTATED RINGERS IV SOLN: INTRAVENOUS | Status: DC

## 2024-02-08 MED ORDER — CHLOROPROCAINE HCL (PF) 3 % IJ SOLN
INTRAMUSCULAR | Status: DC | PRN
Start: 2024-02-08 — End: 2024-02-08
  Administered 2024-02-08: 1.6 mL via EPIDURAL

## 2024-02-08 MED ORDER — ACETAMINOPHEN 500 MG PO TABS: 1000.0000 mg | ORAL_TABLET | Freq: Four times a day (QID) | ORAL | 0 refills | Status: AC | PRN

## 2024-02-08 SURGICAL SUPPLY — 15 items
GLOVE BIOGEL PI IND STRL 6.5 (GLOVE) ×1 IMPLANT
GLOVE SURG SS PI 6.0 STRL IVOR (GLOVE) ×1 IMPLANT
GOWN STRL REUS W/TWL LRG LVL3 (GOWN DISPOSABLE) ×2 IMPLANT
NDL MAYO CATGUT SZ4 TPR NDL (NEEDLE) ×1 IMPLANT
NEEDLE MAYO CATGUT SZ4 (NEEDLE) ×1 IMPLANT
NS IRRIG 1000ML POUR BTL (IV SOLUTION) ×1 IMPLANT
PACK VAGINAL MINOR WOMEN LF (CUSTOM PROCEDURE TRAY) ×1 IMPLANT
PAD OB MATERNITY 4.3X12.25 (PERSONAL CARE ITEMS) ×1 IMPLANT
PAD PREP 24X48 CUFFED NSTRL (MISCELLANEOUS) ×1 IMPLANT
SUT MERSILENE FIBER S 5 MO-4 1 (SUTURE) IMPLANT
SUT POLYDEK 5 CE 75 36 (SUTURE) ×1 IMPLANT
TOWEL OR 17X24 6PK STRL BLUE (TOWEL DISPOSABLE) ×2 IMPLANT
TRAY FOLEY W/BAG SLVR 14FR LF (SET/KITS/TRAYS/PACK) IMPLANT
TUBING NON-CON 1/4 X 20 CONN (TUBING) IMPLANT
YANKAUER SUCT BULB TIP NO VENT (SUCTIONS) IMPLANT

## 2024-02-08 NOTE — Interval H&P Note (Signed)
 History and Physical Interval Note:  02/08/2024 8:54 AM  [name]  has presented today for surgery, with the diagnosis of incompetence cervix.  The various methods of treatment have been discussed with the patient and family. After consideration of risks, benefits and other options for treatment, the patient has consented to  Procedure(s): CERCLAGE, CERVIX, VAGINAL APPROACH (N/A) as a surgical intervention.  The patient's history has been reviewed, patient examined, no change in status, stable for surgery.  I have reviewed the patient's chart and labs.  Questions were answered to the patient's satisfaction.     [name]

## 2024-02-08 NOTE — Discharge Instructions (Signed)
 Call office with any concerns [phone]

## 2024-02-08 NOTE — Anesthesia Procedure Notes (Signed)
 Spinal  Patient location during procedure: OR Start time: 02/08/2024 9:30 AM End time: 02/08/2024 9:34 AM Reason for block: surgical anesthesia Staffing Performed: anesthesiologist  Anesthesiologist: Epifanio Charleston, MD Performed by: Epifanio Charleston, MD Authorized by: Epifanio Charleston, MD   Preanesthetic Checklist Completed: patient identified, IV checked, site marked, risks and benefits discussed, surgical consent, monitors and equipment checked, pre-op evaluation and timeout performed Spinal Block Patient position: sitting Prep: DuraPrep Patient monitoring: heart rate, cardiac monitor, continuous pulse ox and blood pressure Approach: midline Location: L3-4 Injection technique: single-shot Needle Needle type: Pencan  Needle gauge: 24 G Needle length: 9 cm Assessment Sensory level: T4 Events: CSF return

## 2024-02-08 NOTE — Op Note (Signed)
 Operative Note  Date: 02/08/24  Preoperative Diagnosis: [redacted] weeks gestation History of cervical insufficiency  Postoperative Diagnosis:  1.   [redacted] weeks gestation 2.   History of cervical insufficiency  Procedure: McDonald cervical cerclage  Surgeon: LOIS Sharps, DO Assist: CANDIE Guppy, MD  Operative Findings: Parous os, scarred cervical stroma  Anesthesia: Spinal EBL Minimal UOP 10   DESCRIPTION OF PROCEDURE:  After informed consent was reviewed with the patient including risks, benefits, and alternatives of surgical procedure, she was transported to the OR after spinal anesthesia was placed and found to be adequate.  She was placed in the Yellofins stirrups, prepped and draped in the normal sterile fashion.  A catheter was placed under sterile technique. A weighted speculum was placed into the vagina and a Dever retractor was also used to aid in visualization. A McDonald cerclage was placed with suture from 1 to 11, 10 to 8, 7 to 5, 5 to 2, 2 back to midnight with a Merseline tape giving her a cervical length of 1.5 to 2 cm.  The cervix was noted to be hemostatic.  The patient was given bleeding and pain precautions.  She was discharged to the PACU in stable condition. Sponge, lap, and needle counts were correct x2 per the operating room staff.    SHARPS MORT

## 2024-02-08 NOTE — Transfer of Care (Signed)
 Immediate Anesthesia Transfer of Care Note  Patient: [name]  Procedure(s) Performed: CERCLAGE, CERVIX, VAGINAL APPROACH  Patient Location: PACU  Anesthesia Type:Spinal  Level of Consciousness: awake, alert , and oriented  Airway & Oxygen Therapy: Patient Spontanous Breathing  Post-op Assessment: Report given to RN and Post -op Vital signs reviewed and stable  Post vital signs: Reviewed and stable  Last Vitals:  Vitals Value Taken Time  BP 111/59 02/08/24 10:22  Temp    Pulse 79 02/08/24 10:25  Resp 19 02/08/24 10:26  SpO2 100 % 02/08/24 10:25  Vitals shown include unfiled device data.  Last Pain:  Vitals:   02/08/24 0757  TempSrc: Oral         Complications: No notable events documented.

## 2024-02-08 NOTE — Anesthesia Preprocedure Evaluation (Addendum)
 Anesthesia Evaluation  Patient identified by MRN, date of birth, ID band Patient awake    Reviewed: Allergy & Precautions, NPO status , Patient's Chart, lab work & pertinent test results  Airway Mallampati: II  TM Distance: >3 FB Neck ROM: Full    Dental   Pulmonary former smoker   breath sounds clear to auscultation       Cardiovascular negative cardio ROS  Rhythm:Regular Rate:Normal     Neuro/Psych negative neurological ROS     GI/Hepatic negative GI ROS, Neg liver ROS,,,  Endo/Other  diabetes, Type 2    Renal/GU negative Renal ROS     Musculoskeletal   Abdominal   Peds  Hematology negative hematology ROS (+)   Anesthesia Other Findings   Reproductive/Obstetrics (+) Pregnancy                              Anesthesia Physical Anesthesia Plan  ASA: 2  Anesthesia Plan: Spinal   Post-op Pain Management: Minimal or no pain anticipated   Induction:   PONV Risk Score and Plan: 2 and Ondansetron  and Dexamethasone   Airway Management Planned: Natural Airway  Additional Equipment:   Intra-op Plan:   Post-operative Plan:   Informed Consent: I have reviewed the patients History and Physical, chart, labs and discussed the procedure including the risks, benefits and alternatives for the proposed anesthesia with the patient or authorized representative who has indicated his/her understanding and acceptance.       Plan Discussed with:   Anesthesia Plan Comments:         Anesthesia Quick Evaluation

## 2024-02-09 NOTE — Anesthesia Postprocedure Evaluation (Signed)
 Anesthesia Post Note  Patient: [name]  Procedure(s) Performed: CERCLAGE, CERVIX, VAGINAL APPROACH     Patient location during evaluation: PACU Anesthesia Type: Spinal Level of consciousness: awake and alert Pain management: pain level controlled Vital Signs Assessment: post-procedure vital signs reviewed and stable Respiratory status: spontaneous breathing and respiratory function stable Cardiovascular status: blood pressure returned to baseline and stable Postop Assessment: spinal receding Anesthetic complications: no   No notable events documented.  Last Vitals:  Vitals:   02/08/24 1200 02/08/24 1230  BP: 119/72 127/77  Pulse: 81   Resp: (!) 23 16  Temp:  36.8 C  SpO2: 100%     Last Pain:  Vitals:   02/08/24 1230  TempSrc: Axillary  PainSc: 0-No pain                 Epifanio Lamar BRAVO

## 2024-06-25 ENCOUNTER — Inpatient Hospital Stay (HOSPITAL_COMMUNITY)
Admission: AD | Admit: 2024-06-25 | Discharge: 2024-06-25 | Disposition: A | Discharge status: Home / Self Care | Attending: Obstetrics and Gynecology | Admitting: Obstetrics and Gynecology

## 2024-06-25 ENCOUNTER — Encounter (HOSPITAL_COMMUNITY): Payer: Self-pay | Admitting: Obstetrics

## 2024-06-25 DIAGNOSIS — Z5941 Food insecurity: Secondary | ICD-10-CM | POA: Insufficient documentation

## 2024-06-25 DIAGNOSIS — O218 Other vomiting complicating pregnancy: Secondary | ICD-10-CM | POA: Diagnosis not present

## 2024-06-25 DIAGNOSIS — K529 Noninfective gastroenteritis and colitis, unspecified: Secondary | ICD-10-CM | POA: Insufficient documentation

## 2024-06-25 DIAGNOSIS — O26893 Other specified pregnancy related conditions, third trimester: Secondary | ICD-10-CM | POA: Diagnosis not present

## 2024-06-25 DIAGNOSIS — R7401 Elevation of levels of liver transaminase levels: Secondary | ICD-10-CM | POA: Diagnosis not present

## 2024-06-25 DIAGNOSIS — Z3A33 33 weeks gestation of pregnancy: Secondary | ICD-10-CM | POA: Insufficient documentation

## 2024-06-25 DIAGNOSIS — O212 Late vomiting of pregnancy: Secondary | ICD-10-CM | POA: Diagnosis present

## 2024-06-25 LAB — COMPREHENSIVE METABOLIC PANEL WITH GFR
ALT: 74 U/L — ABNORMAL HIGH (ref 0–44)
AST: 45 U/L — ABNORMAL HIGH (ref 15–41)
Albumin: 3.6 g/dL (ref 3.5–5.0)
Alkaline Phosphatase: 190 U/L — ABNORMAL HIGH (ref 38–126)
Anion gap: 13 (ref 5–15)
BUN: <5 mg/dL — ABNORMAL LOW (ref 6–20)
CO2: 21 mmol/L — ABNORMAL LOW (ref 22–32)
Calcium: 8.9 mg/dL (ref 8.9–10.3)
Chloride: 103 mmol/L (ref 98–111)
Creatinine, Ser: 0.64 mg/dL (ref 0.44–1.00)
GFR, Estimated: >60 mL/min
Glucose, Bld: 86 mg/dL (ref 70–99)
Potassium: 3.6 mmol/L (ref 3.5–5.1)
Sodium: 137 mmol/L (ref 135–145)
Total Bilirubin: 1.2 mg/dL (ref 0.0–1.2)
Total Protein: 7.2 g/dL (ref 6.5–8.1)

## 2024-06-25 LAB — CBC
HCT: 36.5 % (ref 36.0–46.0)
Hemoglobin: 11.7 g/dL — ABNORMAL LOW (ref 12.0–15.0)
MCH: 26.2 pg (ref 26.0–34.0)
MCHC: 32.1 g/dL (ref 30.0–36.0)
MCV: 81.7 fL (ref 80.0–100.0)
Platelets: 262 K/uL (ref 150–400)
RBC: 4.47 MIL/uL (ref 3.87–5.11)
RDW: 13.1 % (ref 11.5–15.5)
WBC: 9 K/uL (ref 4.0–10.5)
nRBC: 0.2 % (ref 0.0–0.2)

## 2024-06-25 LAB — URINALYSIS, ROUTINE W REFLEX MICROSCOPIC
Bilirubin Urine: NEGATIVE
Glucose, UA: NEGATIVE mg/dL
Ketones, ur: 20 mg/dL — AB
Nitrite: NEGATIVE
Protein, ur: NEGATIVE mg/dL
Specific Gravity, Urine: 1.01 (ref 1.005–1.030)
WBC, UA: >50 WBC/hpf (ref 0–5)
pH: 5 (ref 5.0–8.0)

## 2024-06-25 MED ORDER — ONDANSETRON 4 MG PO TBDP: 4.0000 mg | ORAL_TABLET | Freq: Three times a day (TID) | ORAL | 0 refills | Status: AC | PRN

## 2024-06-25 MED ORDER — PROMETHAZINE HCL 12.5 MG PO TABS: 12.5000 mg | ORAL_TABLET | Freq: Four times a day (QID) | ORAL | 0 refills | Status: AC | PRN

## 2024-06-25 MED ORDER — FAMOTIDINE IN NACL 20-0.9 MG/50ML-% IV SOLN
20.0000 mg | Freq: Once | INTRAVENOUS | Status: AC
  Administered 2024-06-25: 20 mg via INTRAVENOUS
  Filled 2024-06-25: qty 50

## 2024-06-25 MED ORDER — LACTATED RINGERS IV BOLUS
1000.0000 mL | Freq: Once | INTRAVENOUS | Status: AC
  Administered 2024-06-25: 1000 mL via INTRAVENOUS

## 2024-06-25 MED ORDER — ONDANSETRON HCL 4 MG/2ML IJ SOLN
4.0000 mg | Freq: Once | INTRAMUSCULAR | Status: AC
  Administered 2024-06-25: 4 mg via INTRAVENOUS
  Filled 2024-06-25: qty 2

## 2024-06-25 NOTE — MAU Note (Addendum)
..  [name] is a 39 y.o. at [redacted]w[redacted]d here in MAU reporting: Today began having nausea/ vomiting has had 5 episodes of emesis. Reports every time she eats/drinks something it comes back up. Watery diarrhea 5 times.  Contractions that began around noon and are 30 minutes apart.  Denies vaginal bleeding or leaking of fluid. +FM  Pain score: 8/10 Vitals:   06/25/24 1932  BP: 132/70  Pulse: 100  Resp: 16  Temp: 100 F (37.8 C)  SpO2: 100%     FHT:145 Lab orders placed from triage:  UA

## 2024-06-25 NOTE — MAU Provider Note (Signed)
 " History     [id number]  Arrival date and time: 06/25/24 1809    Chief Complaint  Patient presents with   Nausea   Emesis   Abdominal Pain   Diarrhea     HPI [name] is a 39 y.o. at [redacted]w[redacted]d who presents for nausea/vomiting/diarrhea. She ate cereal for dinner last night and ate a piece of toast this morning so she doesn't think it was foodborne. Her kids have been sick with cold symptoms but no GI. She denies any other sick contacts. Vomited a couple of times today with 3 episodes of watery diarrhea.  Denies VB, LOF. Endorses some contractions. Endorses good FM.     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   Preterm labor    UTI (urinary tract infection)     Past Surgical History:  Procedure Laterality Date   CERVICAL CERCLAGE     CERVICAL CERCLAGE  03/30/2012   Procedure: CERCLAGE CERVICAL;  Surgeon: Ezzie Marshall, MD;  Location: [facility];  Service: Gynecology;  Laterality: N/A;   CERVICAL CERCLAGE N/A 08/19/2013   Procedure: CERCLAGE CERVICAL;  Surgeon: Ezzie Marshall, MD;  Location: [facility];  Service: Gynecology;  Laterality: N/A;   CERVICAL CERCLAGE N/A 06/17/2015   Procedure: CERCLAGE CERVICAL;  Surgeon: Ezzie Buba, MD;  Location: [facility];  Service: Gynecology;  Laterality: N/A;   CERVICAL CERCLAGE N/A 02/08/2024   Procedure: CERCLAGE, CERVIX, VAGINAL APPROACH;  Surgeon: Claudene Larraine LABOR, DO;  Location: [facility];  Service: Obstetrics;  Laterality: N/A;   CHOLECYSTECTOMY  06/20/2006   DILATION AND CURETTAGE OF UTERUS     DILATION AND EVACUATION  04/07/2012   Procedure: DILATATION AND EVACUATION;  Surgeon: Nathanel LELON Bunker, MD;  Location: [facility];  Service: Gynecology;  Laterality: N/A;   TUBAL LIGATION Bilateral 12/13/2015   procedure was NOT done    Family History  Problem Relation Age of Onset    Diabetes Mother    Hypertension Mother    Kidney disease Mother    Heart disease Mother     Social History   Socioeconomic History   Marital status: Single    Spouse name: Not on file   Number of children: Not on file   Years of education: Not on file   Highest education level: Not on file  Occupational History   Not on file  Tobacco Use   Smoking status: Former    Current packs/day: 0.00    Average packs/day: 0.3 packs/day for 2.0 years (0.5 ttl pk-yrs)    Types: Cigarettes    Start date: 08/15/2008    Quit date: 08/15/2010    Years since quitting: 13.8   Smokeless tobacco: Never  Vaping Use   Vaping status: Never Used  Substance and Sexual Activity   Alcohol use: Not Currently    Comment: Social    Drug use: No   Sexual activity: Yes    Birth control/protection: Implant  Other Topics Concern   Not on file  Social History Narrative   Not on file   Social Drivers of Health   Tobacco Use: Medium Risk (06/25/2024)   Patient History    Smoking Tobacco Use: Former    Smokeless Tobacco Use: Never    Passive Exposure: Not on file  Financial Resource Strain: Low Risk (01/02/2024)   Received from  [facility]   Overall Financial Resource Strain (CARDIA)    How hard is it for you to pay for the very basics like food, housing, medical care, and heating?: Not very hard  Food Insecurity: No Food Insecurity (02/08/2024)   Epic    Worried About Programme Researcher, Broadcasting/film/video in the Last Year: Never true    Ran Out of Food in the Last Year: Never true  Recent Concern: Food Insecurity - Food Insecurity Present (01/02/2024)   Received from [facility]   Epic    Within the past 12 months, you worried that your food would run out before you got the money to buy more.: Never true    Within the past 12 months, the food you bought just didn't last and you didn't have money to get more.: Sometimes true  Transportation Needs: No Transportation Needs (02/08/2024)   Epic    Lack of  Transportation (Medical): No    Lack of Transportation (Non-Medical): No  Physical Activity: Insufficiently Active (01/02/2024)   Received from [facility]   Exercise Vital Sign    On average, how many days per week do you engage in moderate to strenuous exercise (like a brisk walk)?: 3 days    On average, how many minutes do you engage in exercise at this level?: 20 min  Stress: No Stress Concern Present (01/02/2024)   Received from [facility] - Occupational Stress Questionnaire    Do you feel stress - tense, restless, nervous, or anxious, or unable to sleep at night because your mind is troubled all the time - these days?: Only a little  Social Connections: Moderately Integrated (01/02/2024)   Received from [facility]   Social Network    How would you rate your social network (family, work, friends)?: Adequate participation with social networks  Intimate Partner Violence: Not At Risk (02/08/2024)   Epic    Fear of Current or Ex-Partner: No    Emotionally Abused: No    Physically Abused: No    Sexually Abused: No  Depression (PHQ2-9): Not on file  Alcohol Screen: Not on file  Housing: Unknown (02/08/2024)   Epic    Unable to Pay for Housing in the Last Year: No    Number of Times Moved in the Last Year: Not on file    Homeless in the Last Year: No  Utilities: Not At Risk (02/08/2024)   Epic    Threatened with loss of utilities: No  Health Literacy: Not on file    Allergies[1]  Medications Ordered Prior to Encounter[2]  Pertinent positives and negative per HPI, all others reviewed and negative  Physical Exam   BP 132/70 (BP Location: Right Arm)   Pulse 100   Temp 100 F (37.8 C)   Resp 16   Ht 5' 7 (1.702 m)   Wt 108 kg   LMP 11/07/2023   SpO2 100%   BMI 37.28 kg/m   Patient Vitals for the past 24 hrs:  BP Temp Pulse Resp SpO2 Height Weight  06/25/24 1932 132/70 100 F (37.8 C) 100 16 100 % 5' 7 (1.702 m) 108 kg     Physical Exam Vitals and nursing note reviewed.  Constitutional:      Appearance: She is well-developed.  HENT:     Head: Normocephalic and atraumatic.     Mouth/Throat:     Mouth: Mucous membranes are moist.  Eyes:     Extraocular Movements: Extraocular movements  intact.  Cardiovascular:     Rate and Rhythm: Normal rate and regular rhythm.  Pulmonary:     Effort: Pulmonary effort is normal.  Abdominal:     Palpations: Abdomen is soft.     Tenderness: There is generalized abdominal tenderness.  Skin:    Capillary Refill: Capillary refill takes less than 2 seconds.  Neurological:     General: No focal deficit present.     Mental Status: She is alert.     FHT Baseline: 140 bpm Variability: Good {> 6 bpm) Accelerations: Reactive Decelerations: Absent Uterine activity: Irregular every 10 minutes prior to fluid administration  Labs Results for orders placed or performed during the hospital encounter of 06/25/24 (from the past 24 hours)  Urinalysis, Routine w reflex microscopic -Urine, Clean Catch     Status: Abnormal   Collection Time: 06/25/24  7:42 PM  Result Value Ref Range   Color, Urine YELLOW YELLOW   APPearance CLOUDY (A) CLEAR   Specific Gravity, Urine 1.010 1.005 - 1.030   pH 5.0 5.0 - 8.0   Glucose, UA NEGATIVE NEGATIVE mg/dL   Hgb urine dipstick MODERATE (A) NEGATIVE   Bilirubin Urine NEGATIVE NEGATIVE   Ketones, ur 20 (A) NEGATIVE mg/dL   Protein, ur NEGATIVE NEGATIVE mg/dL   Nitrite NEGATIVE NEGATIVE   Leukocytes,Ua LARGE (A) NEGATIVE   RBC / HPF 6-10 0 - 5 RBC/hpf   WBC, UA >50 0 - 5 WBC/hpf   Bacteria, UA MANY (A) NONE SEEN   Squamous Epithelial / HPF 6-10 0 - 5 /HPF   Mucus PRESENT   CBC     Status: Abnormal   Collection Time: 06/25/24  8:30 PM  Result Value Ref Range   WBC 9.0 4.0 - 10.5 K/uL   RBC 4.47 3.87 - 5.11 MIL/uL   Hemoglobin 11.7 (L) 12.0 - 15.0 g/dL   HCT 63.4 63.9 - 53.9 %   MCV 81.7 80.0 - 100.0 fL   MCH 26.2 26.0 - 34.0 pg    MCHC 32.1 30.0 - 36.0 g/dL   RDW 86.8 88.4 - 84.4 %   Platelets 262 150 - 400 K/uL   nRBC 0.2 0.0 - 0.2 %  Comprehensive metabolic panel     Status: Abnormal   Collection Time: 06/25/24  8:30 PM  Result Value Ref Range   Sodium 137 135 - 145 mmol/L   Potassium 3.6 3.5 - 5.1 mmol/L   Chloride 103 98 - 111 mmol/L   CO2 21 (L) 22 - 32 mmol/L   Glucose, Bld 86 70 - 99 mg/dL   BUN <5 (L) 6 - 20 mg/dL   Creatinine, Ser 9.35 0.44 - 1.00 mg/dL   Calcium  8.9 8.9 - 10.3 mg/dL   Total Protein 7.2 6.5 - 8.1 g/dL   Albumin 3.6 3.5 - 5.0 g/dL   AST 45 (H) 15 - 41 U/L   ALT 74 (H) 0 - 44 U/L   Alkaline Phosphatase 190 (H) 38 - 126 U/L   Total Bilirubin 1.2 0.0 - 1.2 mg/dL   GFR, Estimated >39 >39 mL/min   Anion gap 13 5 - 15    Imaging No results found.  MAU Course  Procedures  Lab Orders         Urinalysis, Routine w reflex microscopic -Urine, Clean Catch         CBC         Comprehensive metabolic panel    Meds ordered this encounter  Medications   lactated ringers  bolus  1,000 mL   famotidine  (PEPCID ) IVPB 20 mg premix   ondansetron  (ZOFRAN ) injection 4 mg   ondansetron  (ZOFRAN -ODT) 4 MG disintegrating tablet    Sig: Take 1 tablet (4 mg total) by mouth every 8 (eight) hours as needed for nausea or vomiting.    Dispense:  20 tablet    Refill:  0   Imaging Orders  No imaging studies ordered today    MDM Moderate (Level 3-4)  Assessment and Plan  Gastroenteritis  [redacted] weeks gestation of pregnancy   #FWB: NST: Reactive  [name] is a 39 y.o. at [redacted]w[redacted]d who presents for nausea/vomiting/diarrhea.   -Mild elevation of transaminases on CMP. Recommended recheck at next OB visit. -CBC unremarkable -Given 1L LR, 4mg  of zofran  and 20mg  of Pepcid  with resolution of symptoms with patient able to tolerate PO intake. Also contractions subsided.  -Stable for discharge home -Rx sent for Zofran  and phenergan  as patient states before the diarrhea, she is typically  constipated. -All questions answered, anticipatory guidance and detailed return precautions provided.     Sherrill Mckamie L Puneet Masoner, MD/MHA 06/25/2024 9:31 PM  Allergies as of 06/25/2024   No Known Allergies      Medication List     STOP taking these medications    ondansetron  4 MG tablet Commonly known as: Zofran        TAKE these medications    acetaminophen  500 MG tablet Commonly known as: TYLENOL  Take 2 tablets (1,000 mg total) by mouth every 6 (six) hours as needed for moderate pain (pain score 4-6).   ondansetron  4 MG disintegrating tablet Commonly known as: ZOFRAN -ODT Take 1 tablet (4 mg total) by mouth every 8 (eight) hours as needed for nausea or vomiting.   Prenatal Complete 14-0.4 MG Tabs Take 1 tablet by mouth daily.           [1] No Known Allergies [2]  No current facility-administered medications on file prior to encounter.   Current Outpatient Medications on File Prior to Encounter  Medication Sig Dispense Refill   acetaminophen  (TYLENOL ) 500 MG tablet Take 2 tablets (1,000 mg total) by mouth every 6 (six) hours as needed for moderate pain (pain score 4-6). 30 tablet 0   ondansetron  (ZOFRAN ) 4 MG tablet Take 2 tablets (8 mg total) by mouth 2 (two) times daily. 20 tablet 0   Prenatal Vit-Fe Fumarate-FA (PRENATAL COMPLETE) 14-0.4 MG TABS Take 1 tablet by mouth daily. 60 tablet 3   "

## 2024-08-13 ENCOUNTER — Inpatient Hospital Stay (HOSPITAL_COMMUNITY): Admit: 2024-08-13
